# Patient Record
Sex: Male | Born: 1935 | Race: White | Hispanic: No | Smoking: Former smoker
Health system: Southern US, Community
[De-identification: ages and names within clinical notes are randomized; demographics above are authoritative.]

## PROBLEM LIST (undated history)

## (undated) DIAGNOSIS — E785 Hyperlipidemia, unspecified: Secondary | ICD-10-CM

## (undated) DIAGNOSIS — I509 Heart failure, unspecified: Secondary | ICD-10-CM

## (undated) DIAGNOSIS — I2510 Atherosclerotic heart disease of native coronary artery without angina pectoris: Secondary | ICD-10-CM

## (undated) HISTORY — DX: Atherosclerotic heart disease of native coronary artery without angina pectoris: I25.10

## (undated) HISTORY — DX: Hyperlipidemia, unspecified: E78.5

## (undated) HISTORY — PX: CORONARY ARTERY BYPASS GRAFT: SHX141

## (undated) HISTORY — DX: Heart failure, unspecified: I50.9

## (undated) HISTORY — PX: CARDIAC CATHETERIZATION: SHX172

---

## 1999-09-28 ENCOUNTER — Ambulatory Visit (HOSPITAL_COMMUNITY): Admission: RE | Admit: 1999-09-28 | Discharge: 1999-09-29 | Payer: Self-pay | Admitting: *Deleted

## 1999-12-01 ENCOUNTER — Encounter: Payer: Self-pay | Admitting: Cardiothoracic Surgery

## 1999-12-05 ENCOUNTER — Encounter: Payer: Self-pay | Admitting: Cardiothoracic Surgery

## 1999-12-05 ENCOUNTER — Inpatient Hospital Stay (HOSPITAL_COMMUNITY): Admission: RE | Admit: 1999-12-05 | Discharge: 1999-12-12 | Payer: Self-pay | Admitting: Cardiothoracic Surgery

## 1999-12-06 ENCOUNTER — Encounter: Payer: Self-pay | Admitting: Cardiothoracic Surgery

## 1999-12-07 ENCOUNTER — Encounter: Payer: Self-pay | Admitting: Cardiothoracic Surgery

## 2000-03-01 ENCOUNTER — Encounter: Payer: Self-pay | Admitting: Cardiothoracic Surgery

## 2000-03-01 ENCOUNTER — Encounter: Admission: RE | Admit: 2000-03-01 | Discharge: 2000-03-01 | Payer: Self-pay | Admitting: Cardiothoracic Surgery

## 2006-02-19 ENCOUNTER — Ambulatory Visit (HOSPITAL_COMMUNITY): Admission: RE | Admit: 2006-02-19 | Discharge: 2006-02-19 | Payer: Self-pay | Admitting: Family Medicine

## 2006-02-26 ENCOUNTER — Encounter (HOSPITAL_COMMUNITY): Admission: RE | Admit: 2006-02-26 | Discharge: 2006-03-28 | Payer: Self-pay | Admitting: Family Medicine

## 2006-03-06 ENCOUNTER — Ambulatory Visit: Payer: Self-pay | Admitting: Internal Medicine

## 2006-03-19 ENCOUNTER — Ambulatory Visit: Payer: Self-pay | Admitting: Cardiovascular Disease

## 2006-03-20 ENCOUNTER — Ambulatory Visit: Payer: Self-pay | Admitting: Cardiology

## 2006-03-20 ENCOUNTER — Ambulatory Visit (HOSPITAL_COMMUNITY): Admission: RE | Admit: 2006-03-20 | Discharge: 2006-03-20 | Payer: Self-pay | Admitting: Cardiovascular Disease

## 2006-06-27 ENCOUNTER — Ambulatory Visit: Payer: Self-pay | Admitting: Cardiovascular Disease

## 2006-08-01 ENCOUNTER — Ambulatory Visit: Payer: Self-pay | Admitting: Cardiovascular Disease

## 2006-10-24 ENCOUNTER — Ambulatory Visit: Payer: Self-pay | Admitting: Cardiovascular Disease

## 2007-01-24 ENCOUNTER — Ambulatory Visit: Payer: Self-pay | Admitting: Cardiovascular Disease

## 2007-04-16 ENCOUNTER — Ambulatory Visit (HOSPITAL_COMMUNITY): Admission: RE | Admit: 2007-04-16 | Discharge: 2007-04-16 | Payer: Self-pay | Admitting: Family Medicine

## 2007-04-19 ENCOUNTER — Ambulatory Visit: Payer: Self-pay | Admitting: Cardiovascular Disease

## 2008-05-27 ENCOUNTER — Encounter (INDEPENDENT_AMBULATORY_CARE_PROVIDER_SITE_OTHER): Payer: Self-pay | Admitting: *Deleted

## 2008-05-27 LAB — CONVERTED CEMR LAB
ALT: 16 units/L
AST: 16 units/L
Alkaline Phosphatase: 73 units/L
BUN: 20 mg/dL
Chloride: 99 meq/L
Cholesterol: 162 mg/dL
Creatinine, Ser: 1.29 mg/dL
HDL: 29 mg/dL
LDL Cholesterol: 110 mg/dL
TSH: 1.617 microintl units/mL
Total Protein: 7.7 g/dL
Triglycerides: 115 mg/dL

## 2008-06-24 DIAGNOSIS — E119 Type 2 diabetes mellitus without complications: Secondary | ICD-10-CM

## 2008-06-24 DIAGNOSIS — I2510 Atherosclerotic heart disease of native coronary artery without angina pectoris: Secondary | ICD-10-CM

## 2008-06-24 DIAGNOSIS — R0602 Shortness of breath: Secondary | ICD-10-CM

## 2008-06-24 DIAGNOSIS — R609 Edema, unspecified: Secondary | ICD-10-CM

## 2008-06-24 DIAGNOSIS — I10 Essential (primary) hypertension: Secondary | ICD-10-CM

## 2008-06-24 DIAGNOSIS — I255 Ischemic cardiomyopathy: Secondary | ICD-10-CM

## 2008-06-24 DIAGNOSIS — E7800 Pure hypercholesterolemia, unspecified: Secondary | ICD-10-CM | POA: Insufficient documentation

## 2008-06-25 ENCOUNTER — Ambulatory Visit: Payer: Self-pay | Admitting: Cardiovascular Disease

## 2009-01-11 ENCOUNTER — Encounter (INDEPENDENT_AMBULATORY_CARE_PROVIDER_SITE_OTHER): Payer: Self-pay | Admitting: *Deleted

## 2009-01-11 ENCOUNTER — Ambulatory Visit: Payer: Self-pay | Admitting: Cardiovascular Disease

## 2009-08-10 ENCOUNTER — Ambulatory Visit: Payer: Self-pay | Admitting: Cardiovascular Disease

## 2009-08-10 DIAGNOSIS — I509 Heart failure, unspecified: Secondary | ICD-10-CM | POA: Insufficient documentation

## 2009-08-16 ENCOUNTER — Encounter: Payer: Self-pay | Admitting: Cardiovascular Disease

## 2009-08-16 ENCOUNTER — Ambulatory Visit: Payer: Self-pay | Admitting: Cardiology

## 2009-08-16 ENCOUNTER — Ambulatory Visit (HOSPITAL_COMMUNITY): Admission: RE | Admit: 2009-08-16 | Discharge: 2009-08-16 | Payer: Self-pay | Admitting: Cardiovascular Disease

## 2010-03-08 NOTE — Assessment & Plan Note (Signed)
Summary: 6 mth f/u per checkout on 01/11/09/tg   Visit Type:  6 month follow up Primary Provider:  Dr. Regino Schultze   History of Present Illness: Phillip Henderson is seen today for F/U of ischemic DCM.  His EF has ranged from 20-35%.  He is funcitonal class one and has not wanted agressive w/u.  I tried to get him to think about a biV AICD since he has had a large anterior MI in 1979 and undoubtedly has a large scar substrate.  he has not wanted to have a cath or consideration for AICD or randomizaiton in the Determine Trial.  He is active with no perceived dyspnea, SSCP or edema.  He has not had palpitations or syncope.  he has been compliant with his meds.  I reviewed lab work form Dr. Lindalou Hose office 05/2008 and his lytes and lfts were fine.  Given his good functional status and lack of symptoms I told him we would continue medical Rx. but have a low thresshold for more agressive care should things change.  He needs a F/U echo for EF  Previous anterior MI in late 90's and CABG inj 2001 Coronary artery bypass grafting x 5 with the left internal mammary artery to the first diagonal coronary artery, sequential saphenous vein graft to the distal left anterior descending, reversed saphenous vein graft to the obtuse marginal, sequential saphenous vein graft to the proximal posterior descending and distal posterior descending.  Preventive Screening-Counseling & Management  Alcohol-Tobacco     Smoking Status: quit  Current Problems (verified): 1)  CHF  (ICD-428.0) 2)  Cad  (ICD-414.00) 3)  Cardiomyopathy  (ICD-425.4) 4)  Hypertension  (ICD-401.9) 5)  Hypercholesterolemia  (ICD-272.0) 6)  Dyspnea  (ICD-786.05) 7)  Diabetes Mellitus  (ICD-250.00) 8)  Edema  (ICD-782.3)  Current Medications (verified): 1)  Altace 10 Mg Caps (Ramipril) .... 2 Tabs By Mouth Once Daily 2)  Lopressor 100 Mg Tabs (Metoprolol Tartrate) .Marland Kitchen.. 1 Tab By Mouth Once Daily 3)  Pravastatin Sodium 20 Mg Tabs (Pravastatin Sodium) ....  Take One Tablet By Mouth Daily At Bedtime 4)  Klor-Con 10 10 Meq Cr-Tabs (Potassium Chloride) .Marland Kitchen.. 1 Tab By Mouth Once Daily 5)  Furosemide 40 Mg Tabs (Furosemide) .Marland Kitchen.. 1 Tab By Mouth Two Times A Day 6)  Aspirin 81 Mg Tbec (Aspirin) .... Take One Tablet By Mouth Daily 7)  Lantus .Marland Kitchen.. 60 Units A Once Daily  Allergies (verified): No Known Drug Allergies  Past History:  Past Medical History: Last updated: 06/25/2008 CAD: Ant MI 1979 CABG 2001 [place] D1 SVG LAD SVG om SVG PDA EF 30% CHF Diabetes:  lantus at night Hyperlipidemia  Past Surgical History: Last updated: 06/25/2008 CABG: 2001 [place]  Family History: Last updated: 06/25/2008 non-contributory  Social History: Last updated: 06/25/2008 Widowed Lives with daughter between [place] Non-smoker Non-drinker Drives and does all ADL's  Social History: Smoking Status:  quit  Review of Systems       Denies fever, malais, weight loss, blurry vision, decreased visual acuity, cough, sputum, SOB, hemoptysis, pleuritic pain, palpitaitons, heartburn, abdominal pain, melena, lower extremity edema, claudication, or rash.   Vital Signs:  Patient profile:   76 year old male Height:      72 inches Weight:      207 pounds BMI:     28.18 O2 Sat:      95 % on Room air Pulse rate:   54 / minute BP sitting:   122 / 73  (right arm)  Vitals Entered By:  Dreama Saa, CNA (August 10, 2009 10:36 AM)  O2 Flow:  Room air  Physical Exam  General:  Affect appropriate Healthy:  appears stated age HEENT: normal Neck supple with no adenopathy JVP normal no bruits no thyromegaly Lungs clear with no wheezing and good diaphragmatic motion Heart:  S1/S2 no murmur,rub, gallop or click PMI normal Abdomen: benighn, BS positve, no tenderness, no AAA no bruit.  No HSM or HJR Distal pulses intact with no bruits No edema Neuro non-focal Skin warm and dry    Impression & Recommendations:  Problem # 1:  CHF  (ICD-428.0) Stable continue current meds.  Echo His updated medication list for this problem includes:    Altace 10 Mg Caps (Ramipril) .Marland Kitchen... 2 tabs by mouth once daily    Lopressor 100 Mg Tabs (Metoprolol tartrate) .Marland Kitchen... 1 tab by mouth once daily    Furosemide 40 Mg Tabs (Furosemide) .Marland Kitchen... 1 tab by mouth two times a day    Aspirin 81 Mg Tbec (Aspirin) .Marland Kitchen... Take one tablet by mouth daily  Orders: 2-D Echocardiogram (2D Echo)  Problem # 2:  CAD (ICD-414.00) Distant CABG  No angina.  Does not want myovue. His updated medication list for this problem includes:    Altace 10 Mg Caps (Ramipril) .Marland Kitchen... 2 tabs by mouth once daily    Lopressor 100 Mg Tabs (Metoprolol tartrate) .Marland Kitchen... 1 tab by mouth once daily    Aspirin 81 Mg Tbec (Aspirin) .Marland Kitchen... Take one tablet by mouth daily  Problem # 3:  HYPERTENSION (ICD-401.9) Well controlled His updated medication list for this problem includes:    Altace 10 Mg Caps (Ramipril) .Marland Kitchen... 2 tabs by mouth once daily    Lopressor 100 Mg Tabs (Metoprolol tartrate) .Marland Kitchen... 1 tab by mouth once daily    Furosemide 40 Mg Tabs (Furosemide) .Marland Kitchen... 1 tab by mouth two times a day    Aspirin 81 Mg Tbec (Aspirin) .Marland Kitchen... Take one tablet by mouth daily  Problem # 4:  HYPERCHOLESTEROLEMIA (ICD-272.0) Labs per primary His updated medication list for this problem includes:    Pravastatin Sodium 20 Mg Tabs (Pravastatin sodium) .Marland Kitchen... Take one tablet by mouth daily at bedtime  Patient Instructions: 1)  Your physician recommends that you schedule a follow-up appointment in: 1 year 2)  Your physician has requested that you have an echocardiogram.  Echocardiography is a painless test that uses sound waves to create images of your heart. It provides your doctor with information about the size and shape of your heart and how well your heart's chambers and valves are working.  This procedure takes approximately one hour. There are no restrictions for this procedure.  Prevention & Chronic  Care Immunizations   Influenza vaccine: Not documented    Tetanus booster: Not documented    Pneumococcal vaccine: Not documented    H. zoster vaccine: Not documented  Colorectal Screening   Hemoccult: Not documented    Colonoscopy: Not documented  Other Screening   PSA: Not documented   Smoking status: quit  (08/10/2009)    Screening comments: quit in 1974  Diabetes Mellitus   HgbA1C: Not documented    Eye exam: Not documented    Foot exam: Not documented   High risk foot: Not documented   Foot care education: Not documented    Urine microalbumin/creatinine ratio: Not documented  Lipids   Total Cholesterol: 162  (05/27/2008)   LDL: 110  (05/27/2008)   LDL Direct: Not documented   HDL: 29  (05/27/2008)  Triglycerides: 115  (05/27/2008)    SGOT (AST): 16  (05/27/2008)   SGPT (ALT): 16  (05/27/2008)   Alkaline phosphatase: 73  (05/27/2008)   Total bilirubin: Not documented  Hypertension   Last Blood Pressure: 122 / 73  (08/10/2009)   Serum creatinine: 1.29  (05/27/2008)   Serum potassium 4.1  (05/27/2008)  Self-Management Support :    Diabetes self-management support: Not documented    Hypertension self-management support: Not documented    Lipid self-management support: Not documented

## 2010-06-21 NOTE — Assessment & Plan Note (Signed)
[facility]                       [place] CARDIOLOGY OFFICE NOTE   LOPEZ, DENTINGER                        MRN:          [id number]  DATE:10/24/2006                            DOB:          [birth year]    [name] returns today for followup.  He has had a previous anterior  wall MI back in 1979.  His EF has dropped a bit recently to 15% to 20%.  He has had previous bypass surgery.  The patient, unfortunately, does  not want really much in terms of invasive workup.  He has refused heart  catheterization.  He has refused referral for AICD.   We have been adjusting his medications.  His blood pressure had been  running high.   I talked to him at length today regarding stopping his amlodipine and  increasing his ramipril to 20 mg a day.  He is willing to do this.  He  is currently functional class I to II.  He walks on a daily basis.  He  is not having PND or orthopnea.  His weight is stable.  There has been  no lower extremity edema.  Fortunately, he has not been having any  significant chest pain.   [facility] understands the fact that he has an ischemic cardiomyopathy, and  that he is at risk for sudden death congestive heart failure.   REVIEW OF SYSTEMS:  Remarkable for continued problems with his sugar.  When we last saw him, we had talked to Dr. Nobie Putnam about stopping his  Actos.  Unfortunately, the Januvia is not quite doing the trick.  He  will follow up with him, as his blood sugars have been running high when  he checks them at home on a b.i.d. basis.   CURRENT MEDICATIONS:  Include:  1. Long-acting Lopressor 100 a day.  2. Lasix 20 a day.  3. Ramipril to be increased to 20 a day.  4. Amlodipine to be discontinued.  5. Pravastatin 20 a day.  6. Aspirin a day.  7. Janumet, current dose unknown.   EXAMINATION:  Remarkable for a healthy-appearing elderly white male in  no distress.  Affect is appropriate.  Weight is 204.  Blood  pressure is 138/70.  Pulse is 60 and regular.  HEENT:  Normal.  Carotids normal without bruit.  There is no lymphadenopathy.  No  thyromegaly.  No JVP elevation.  PMI is increased and laterally displaced.  LUNGS:  Clear with good diaphragmatic motion.  No wheezing.  ABDOMEN:  Benign.  Bowel sounds positive.  No AAA.  No bruit.  No  hepatosplenomegaly.  No hepatojugular reflux.  No tenderness.  Femorals were +3 bilaterally without bruit.  PTs are +3.  There is no  lower extremity edema.  NEUROLOGIC:  Nonfocal.  There is no muscular weakness.  SKIN:  Warm and dry.  His baseline EKG shows mildly poor R wave progression with lateral T  wave changes in V5 and V6.   IMPRESSION:  1. Coronary disease.  Previous coronary artery bypass graft.  Patient      refusing followup Myoview and/or heart  catheterization.  Currently,      not having chest pain.  Continue aspirin and beta blocker therapy.  2. Decreased ejection fraction with coronary disease.  Risk for      ventricular tachycardia.  Patient refuses risk stratification, and      EP followup.  Still does not want AICD.  3. Hypertension.  Currently stable.  Given his decreased ejection      fraction and ischemic cardiomyopathy, we have stopped his      amlodipine and increased his Ramipril.  He will follow up with me      in 3 months for this.  4. Diabetes.  Currently less optimally controlled.  Follow up with Dr.      Nobie Putnam in the next week or 2.  Continue to abstain from using      Actos, possibly add basal Lantus insulin to his Janumet.  5. Hypercholesterolemia.  Continue pravastatin, particularly in light      of his old bypass graft.  Followup lipid and liver profile in 6      months.   Overall, [name] is stable, and right now he only wants stent retained  medical therapy.  I will see him back in 3 months.     Noralyn Pick. Eden Emms, MD, [facility]  Electronically Signed    PCN/MedQ  DD: 10/24/2006  DT: 10/24/2006  Job #: [id number]

## 2010-06-21 NOTE — Assessment & Plan Note (Signed)
[facility]                       [place] CARDIOLOGY OFFICE NOTE   JERMICHAEL, BELMARES                        MRN:          [id number]  DATE:08/01/2006                            DOB:          20-Jul-1934    Merlene Pulling returns today for followup.  He has a history of anterior  wall MI in 1979.  His EF had been in the 30% range. It recently dropped  to 15% to 20%.  He is status post coronary bypass surgery.  The last  time I saw Inigo I had a long discussion with him regarding his need for  a heart cath given his marked change in the ejection fraction.  He is a  diabetic and may not have a warning system in regards to angina.   Culver did not want to have a heart cath.  When we stopped his Actos his  congestion and shortness of breath markedly improved.  He since has seen  Dr. Nobie Putnam and been started on Januvia.   Again, I spoke to [facility] regarding the need for heart cath.  I also spoke  to him about his candidacy for an AICD.  Since he is feeling better he  does not want to entertain the idea of either one.   I explained to him the risks of sudden death and recurrent MI.  He is  comfortable with this.   His review of systems is remarkable for some chronic diaphoresis and  sweating.  He says he does this because it is hot.  He is not having any  hypoglycemic reactions on the Januvia.  There has been no chest pain,  PND, orthopnea.  No lower extremity edema. Review of systems is  otherwise negative.   His medications include:  1. Metoprolol ER 100 daily.  2. Lasix 20 daily.  3. Metoclopramide 5 t.i.d.  4. Ramipril 10 daily.  5. Amlodipine 2.5.  6. Pravastatin 20 daily.  7. Aspirin daily.  8,  Januvia unknown dose.   The patient's exam is remarkable for a healthy appearing elderly white  male in no distress.  He is a bit sweaty with moist skin.  His weight is 211 which is down 7 pounds since stopping the Actos.  Blood pressure is 100/76,  pulse is 64 and regular.  Respiratory rate is  14.  He is afebrile.  HEENT:  Normal.  NECK:  Supple. There is mild JVP elevation.  There is no carotid bruit.  LUNGS:  Clear with normal diaphragmatic motion.  No wheezing.  There is an S1, S2 with a soft MR murmur at the apex.  PMI is increased  and laterally displaced.  He is status post sternotomy.  Bowel sounds are positive. There is mild hepatojugular reflux.  There is  no hepatosplenomegaly, no AAA, no tenderness, no bruits.  Femorals are +3 bilaterally without bruit.  PTs are +2.  There is no  lower extremity edema.  NEURO:  Nonfocal.  There is no muscular weakness.   IMPRESSION:  1. Ischemic cardiomyopathy, possible silent anterior myocardial      infarction.  The patient  refusing heart cath.  Continue current      dose of diuretics.  He appears euvolemic now that his Actos has      been stopped.  He has currently improved dysfunctional status from      II to I.  2. Blood pressure currently improved.  I suspect that he may be able      to stop his amlodipine in the future, particularly if there is any      lightheadedness, it is the least important drug for his ischemic      cardiomyopathy.  We will follow this in the future if his systolics      were to run much lower than the 100 to 110.  3. Diabetes, currently improved off Actos,  to avoid this and Avandia      at all costs.  Continue Januvia, followup hemoglobin A1C in 3      months.  4. Risk for sudden death discussed at length with patient and need for      an automatic implantable cardiodefibrillator. He refuses at this      time. He will call us if he changes his mind and we will arrange      for him to see electrophysiologist in [place].  5. I will see him back in 3 months.     Noralyn Pick. Eden Emms, MD, [facility]  Electronically Signed    PCN/MedQ  DD: 08/01/2006  DT: 08/01/2006  Job #: [id number]

## 2010-06-21 NOTE — Assessment & Plan Note (Signed)
[facility]                            CARDIOLOGY OFFICE NOTE   MYLIK, PRO                        MRN:          [id number]  DATE:04/19/2007                            DOB:          03-21-1934    Phillip Henderson returns today for followup.  He has longstanding ischemic  cardiomyopathy with previous CABG.  He has refused recent  catheterizations or EP evaluation.  He needs to have a BiV AICD but does  not want to have it.  I explained to him that it may help his heart  failure and prevent sudden death, but he said he would wait and see.  He  is having some dyspnea.  We adjusted his medicines with Dr. Nobie Putnam,  stopping his Actos.  His Lasix is now up to 40 a day.  He feels well.  Apparently he had a chest x-ray with Loraine Leriche recently, and there was a  question of some fluid.  Of course, he has cardiomegaly.  Overall, I  think he is doing better.  He is functional class II.  He does take his  diabetes seriously and is now on Lantus insulin.  His Actos was switched  to Prandin.  I reassured him that metformin was a good medicine and does  not cause fluid retention.   REVIEW OF SYSTEMS:  His review of systems is otherwise negative.  In  particular, he is not having chest pain, palpitations, or presyncope and  has chronic exertional dyspnea related to his heart failure.  His weight  has been stable, although he does not weigh himself every day.   MEDICATIONS:  1. Lopressor 100 mg long-acting a day.  2. Lasix 40 a day.  3. Ramipril 10 a day.  4. Pravastatin 20 a day.  5. An aspirin a day.  6. Metformin 500 a day.  7. Klor-Con 10 a day.  8. Prandin 2 mg b.i.d.  9. Insulin.   PHYSICAL EXAMINATION:  VITAL SIGNS:  Blood pressure is 130/80, pulse 75  and regular, weight 209, afebrile, respiratory rate 14.  HEENT:  Unremarkable.  NECK:  Carotids normal without bruit.  No lymphadenopathy, thyromegaly,  or JVP elevation.  LUNGS:  Clear with good  diaphragmatic motion.  No wheezing.  S1-S2 with  an S3 gallop.  PMI increased.  ABDOMEN:  Benign.  Bowel sounds positive.  No AAA.  No tenderness, no  hepatosplenomegaly, no hepatojugular reflexes.  EXTREMITIES:  Distal pulses are intact.  No edema.  NEUROLOGIC:  Nonfocal.  SKIN:  Warm and dry.  No muscular weakness.   His EKG shows sinus rhythm with left axis deviation and probable old  anterior MI.   IMPRESSION:  1. Ischemic cardiomyopathy, ejection fraction 20-30%.  Continue      current dose of diuretic and angiotensin-converting enzyme      inhibitor.  The patient refusing further workup in regards to      biventricular five the automatic implantable cardioverter      defibrillator.  2. Coronary artery disease, no angina.  Continue aspirin therapy.  The  patient on beta blocker with good heart rate.  3. Diabetes.  Hemoglobin A1c.  Follow up with Dr. Nobie Putnam.  Actos      stopped.  Prandin resumed.  4. Hypercholesteremia in the setting of coronary disease and old      bypass grafts.  Continue pravastatin 20 a day.  Lipid and liver      profile in 6 months.   Phillip Henderson knows to see me or Loraine Leriche is his weight were to go up or he were to  have increasing shortness of breath.  He also knows to take an extra  Lasix.  Depending on what the patient's baseline BMET is, it would be  worthwhile to add spironolactone 50 a day if he has recurrent heart  failure.   I will see him back in 6 months up in [place].     Noralyn Pick. Eden Emms, MD, [facility]  Electronically Signed    PCN/MedQ  DD: 04/19/2007  DT: 04/20/2007  Job #: [id number]

## 2010-06-21 NOTE — Assessment & Plan Note (Signed)
[facility]                       [place] CARDIOLOGY OFFICE NOTE   Phillip Henderson, Phillip Henderson                        MRN:          [id number]  DATE:06/27/2006                            DOB:          1935/01/14    Phillip Henderson returns today for followup. He is status post coronary artery  bypass surgery. He has had a previous anterior wall myocardial  infarction. His EF had been in the 45% range.   He had been having increasing congestive symptoms when I last saw him.  He had a 2D echocardiogram on 03/20/06.   The patient's EF has dropped to 15-20%.   We had stopped his Actos when I last saw him. He feels markedly  improved. He feels his shortness of breath has gotten significantly  better.   The patient does not want to have a heart cath. I talked to him at  length about the fact that his bypass grafts are old, being done in 2001  and that he is at significant decrease in his LV function.   He does not want to have a heart cath at this time. He feels that he has  improved. His Actos has been stopped and he has been switched to  metformin.   He understands the risk of recurrent congestive heart failure.   REVIEW OF SYSTEMS:  Is remarkable for a stable weight. He has had less  PND or orthopnea. He has not had any significant chest pain. His sugars  are still a little bit high.   MEDICATIONS:  1. Long-acting metoprolol 100 a day.  2. Lasix 20 a day.  3. Glip/met 5/500 b.i.d.  4. Metoclopramide 5 t.i.d.  5. Ramipril 10 a day.  6. Amlodipine 2.5 a day.  7. Pravastatin 20 a day.  8. Aspirin a day.   His weight is 218, which is stable. Blood pressure is 140/80, pulse 68  and regular.  HEENT: Is normal. JVP is mildly elevated. There are no carotid bruits.  No lymphadenopathy. No thyromegaly.  LUNGS:  Are clear with normal diaphragmatic motion and no wheezing.  There is an S1, S2 with a soft MR murmur. PMI is increased and laterally  displaced.  ABDOMEN: Is benign. There is no hepatosplenomegaly. There is mild  hepatojugular reflux. There is no AAA. No masses. No tenderness. Bowel  sounds are positive.  Femorals are +2 bilaterally without bruits. PT's are +2 bilaterally. He  has +1 lower extremity edema bilaterally.  NEURO: Is nonfocal.  SKIN: Is warm and dry.  There is no muscular weakness.   IMPRESSION:  1. History of coronary artery bypass surgery without chest pain, but      decrease in left ventricular function. This certainly could be from      remodeling. However, I would still recommend a right and left heart      cath to the patient. He wants to wait four weeks. He is encouraged      that he felt better off of the Actos.  2. I suspect that in the future I will stop his amlodipine.  3. His current dose  of Lasix seems appropriate. We will check a BMET      and a BNP. I will re-discuss the issues of heart cath with him in      four weeks.  4. He will followup with Dr.  Nobie Putnam in regards to better sugar      control. He will continue to avoid Avandia and Actos.  5. His last LDL cholesterol was under 644. He will continue      pravastatin and given his 27-year-old bypass grafts.     Noralyn Pick. Eden Emms, MD, [facility]  Electronically Signed    PCN/MedQ  DD: 06/27/2006  DT: 06/27/2006  Job #: [id number]   cc:   Patrica Duel, M.D.

## 2010-06-21 NOTE — Assessment & Plan Note (Signed)
[facility] HEALTHCARE                       [place] CARDIOLOGY OFFICE NOTE   Phillip Henderson, Phillip Henderson                        MRN:          [id number]  DATE:01/24/2007                            DOB:          1935-01-09    [name] returns today for followup. He is status post CABG.   He has refused further workup in regards to a stress test, cath or EP  evaluation. His EF is 15-20%. He had had some lower extremity edema, but  no symptoms of congestive heart failure recently. I was concerned about  some of his medications. We stopped his calcium blocker and his primary  care doctor, Dr. Nobie Putnam, stop his Actos. He is improved with less  shortness of breath and less lower extremity edema.   He has been compliant with his other medications.  He is not having any  significant chest pain, PND or orthopnea.   REVIEW OF SYSTEMS:  Is remarkable for cut on his finger which continues  to bleed. I told him we would put a Band-Aid on it with some neosporin  today. He did this chopping wood. Otherwise, negative.   Weight 204, blood pressure 140/80, pulse 58 and regular. Afebrile.  Respiratory rate is 14.  HEENT: Is unremarkable. Carotids are normal without bruits. There is no  lymphadenopathy, thyromegaly or JVP elevation.  LUNGS:  Are clear with good diaphragmatic motion. No wheezing.  There is an S1, S2. PMI is increased. There is a slight MR murmur at the  apex.  ABDOMEN: Is benign. Bowel sounds positive. No hepatosplenomegaly. No  hepatojugular reflux.  Distal pulses are intact with no edema.   IMPRESSION:  1. Ischemic cardiomyopathy, currently functional Class 1, stable.      Medications adjusted. Continue low salt diet.  2. Hypercholesterolemia. Continue pravastatin 20 a day. Lipid and      liver profile in six months.  3. Hypertension. Currently well-controlled. Continue ramipril 10 mg a      day.  4. Diabetes, off Actos. Hemoglobin A1c in three months.  Lower      extremity edema improved.  5. Cut on finger, dressed today. No evidence of infection. Should stop      bleeding within the hour.   MEDICATIONS:  1. Metoprolol 100 a day.  2. Lasix 20 a day.  3. Ramipril 10 a day.  4. Pravastatin 20 a day.  5. Aspirin a day.  6. Janumet 50/500.     Noralyn Pick. Eden Emms, MD, [facility]  Electronically Signed    PCN/MedQ  DD: 01/24/2007  DT: 01/24/2007  Job #: [id number]

## 2010-06-24 NOTE — Discharge Summary (Signed)
[place]. [facility]  Patient:    Phillip Henderson, CICERO                        MRN: [id number] Adm. Date:  [id number] Disc. Date: 12/10/99 Attending:  Waldo Laine Dictator:   Lissa Hoard, P.A.-C. CC:         Jesse Sans. Wall, M.D. [facility]   Discharge Summary  DATE OF BIRTH:  1934/11/15  ADMISSION DIAGNOSES: 1. Coronary artery disease. 2. Non-insulin-dependent diabetes mellitus.  DISCHARGE DIAGNOSES: 1. Coronary artery disease. 2. Non-insulin-dependent diabetes mellitus. 3. Status post coronary artery bypass grafting x 5.  HISTORY OF PRESENT ILLNESS:  This is a 76 year old male who has a history of a myocardial infarction in 1979.  He currently has stable angina, typical chest tightness, nonradiating, without nausea or vomiting, which occurs with activity.  Because of these symptoms, the patient had a Cardiolyte perfusion study which showed an area of reversible ischemia involving apical, septal, anterior, and inferior walls of his heart.  His ejection fraction was found to be 35%.  Because of his symptoms, he underwent a cardiac catheterization which revealed diffuse coronary artery disease and an ejection fraction of 40%.  PHYSICAL EXAMINATION:  VITAL SIGNS:  Blood pressure 140/60, pulse 72 and regular, respirations 18.  HEENT:  Within normal limits.  NECK:  No jugular venous distention, carotid bruits, adenopathy, or thyromegaly.  LUNGS:  Clear to auscultation bilaterally.  CARDIAC:  A regular rate and rhythm without murmurs, gallops, or rubs.  ABDOMEN:  Soft, nontender, nondistended, with positive bowel sounds in all four quadrants.  EXTREMITIES:  No cyanosis, clubbing, or edema.  NEUROLOGIC:  Grossly intact.  Because of the findings on his cardiac catheterization, the patient was referred to Dr. Ramon Dredge B. Gerhardt for possible surgical intervention.  HOSPITAL COURSE:  On December 05, 1999, the patient underwent a coronary  artery bypass grafting x 5 with the left internal mammary artery anastomosed to the first diagonal, sequential saphenous vein graft to the proximal posterior descending, and the distal posterior descending, saphenous vein graft to the circumflex, and saphenous vein graft to the left anterior descending coronary artery.  The procedure was performed by Dr. Tyrone Sage under general endotracheal anesthesia without any known complications.  The patient was transferred to the SICU in stable condition, in normal sinus rhythm.  He was requiring an insulin drip for the control of blood sugar postoperatively.  On postoperative day number one the patient was found to be in normal sinus rhythm, with a cardiac index of 2.88.  His mediastinal tubes were discontinued.  His postoperative hemoglobin and hematocrit were 12.3, and 33.6.  His insulin drip was discontinued and he was covered with subcutaneous sliding scale insulin, with good results.  On postoperative day number two his hemoglobin and hematocrit dropped slightly to 10.8, and 28.1.  His BUN and creatinine also rose slightly to 25, and 1.5.  His blood sugars were well-controlled.  His remaining chest tube was discontinued.  He was not requiring any oxygen therapy.  He was mobilizing well.  On postoperative day number three the patient was complaining of some nausea and vomiting, which resolved with the movement of the bowels.  His ambulation had increased and his hemoglobin and hematocrit had stabilized at 10.1 and 28.9.  His BUN and creatinine also started to come down, at 10.0 and 1.0 respectively.  On postoperative day number four the patient was doing well, with no complaints of  nausea or vomiting.  He was in normal sinus rhythm, afebrile, with stable vital signs, doing well.  He was mobilizing well, and he was tolerating p.o. intake.  DISPOSITION:  The discharge plan was made for postoperative number five, on December 10, 1999.  DISCHARGE  MEDICATIONS: 1. Darvocet-N 100 one or two tablets q.4-6h. p.r.n. pain. 2. Aspirin 325 mg one tablet q.d. 3. Altace 5 mg one tablet q.d. 4. Lipitor 20 mg one tablet q.d. 5. Prandin 1 mg t.i.d. 6. Pepcid 20 mg one tablet b.i.d.  DISCHARGE ACTIVITIES:  The patient was told to avoid driving or strenuous activities, or lifting objects over 10 pounds.  DISCHARGED DIET:  An 1800 calorie ADA diet.  WOUND CARE:  To clean the wounds with mild soap and water.  DISPOSITION:  Home.  FOLLOWUP:  The patient is instructed to follow up with Dr. Tyrone Sage on Thursday, January 05, 2000, at 11:10 a.m.  He was instructed to bring a chest x-ray taken by his cardiologist, Dr. Jesse Sans. Wall. The patient was also instructed to come to the CVTS office on Monday, December 19, 1999, for staple removal.  His was also instructed to follow up with his cardiologist, Dr. Maisie Fus C. Wall in two weeks.  The appointment with his cardiologist will be verified by the cardiologists office. DD:  12/09/99 TD:  12/11/99 Job: [id number]

## 2010-06-24 NOTE — Discharge Summary (Signed)
[place]. [facility]  Patient:    Phillip Henderson, Phillip Henderson                        MRN: [id number] Adm. Date:  [id number] Disc. Date: [id number] Attending:  Waldo Laine Dictator:   Loura Pardon, P.A. CC:         Jesse Sans. Daleen Squibb, M.D. [facility]  Gwenith Daily. Tyrone Sage, M.D.   Discharge Summary  ADDENDUM  DATE OF BIRTH:  1934/06/04  [name] was set for discharge on November 3; however, in the early morning of November 3 at 0015 hours to be specific, he had a paroxysmal bout of atrial fibrillation with heart rate in the 120s.  His blood pressure was 140/70, and he was asymptomatic.  He was started on IV digoxin.  Later in the morning of the same day, November 3, he was started on IV Cardizem drip and then changed to an oral Cardizem 30 mg every 6 hours.  On November 4, he had a paroxysm of atrial fibrillation when up and ambulating.  Heart rate was in the 140s.  He was continued on Cardizem, Lopressor, and digoxin through the day on November 4 and overnight into November 5.  He had no further dysrhythmias except occasional PVCs.  He was set to go home on December 12, 1999, with followup with Dr. Daleen Squibb and Dr. Tyrone Sage.  DISCHARGE MEDICATIONS:  His home medications will now include besides those already dictated: 1. Lopressor 50 mg 1/2 tablet in the morning, 1/2 tablet in the evening. 2. Cardizem CD 120 mg daily. 3. Digoxin 0.125 mg daily. DD:  12/12/99 TD:  12/13/99 Job: [id number]
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
## 2010-06-24 NOTE — Cardiovascular Report (Signed)
[place]. [facility]  Patient:    Phillip Henderson, Phillip Henderson                        MRN: [id number] Adm. Date:  [id number] Attending:  Daisey Must                        Cardiac Catheterization  INDICATION:  Mr. [name] is referred by Dr. Jesse Sans. Wall for recurrent angina.  He had a distant anterior wall MI in 1979.  Recent Cardiolite study showed an anteroapical wall infarction with an EF in the high 30s.  RESULTS:  Left main coronary artery had a 30% discrete lesion.  The ostial LAD had a 99% lesion.  The mid LAD was 100% occluded with trickle flow.  There was a large first diagonal branch that 40% multiple discrete lesions.  The left circumflex coronary artery had a long tubular area of 70% stenosis, which was somewhat complicated and aneurysmal.  Right coronary artery was very large in caliber.  There were 30% multiple discrete lesions in the mid and proximal portions.  There was a 70-80% discrete lesion before the takeoff of the PDA.  PDA had 30-40% multiple discrete lesions.  The posterolateral branch had 30% multiple discrete lesions.  RAO ventriculography:  RAO ventricular revealed anteroapical wall hypokinesis with an EF in the 45% range.  There was no MR and no gradiant across the aortic valve.  Left ventricular pressure was 107/31; aortic pressure was 109/69.  IMPRESSION:  Mr. [name] has three-vessel disease with decreased left ventricular function.  He is having exertional and postprandial angina.  We will admit him today for further evaluation.  The patient is very set against having surgery; however, given his clinical scenario and decreased left ventricular function, I think that coronary artery bypass grafting to the diagonal circumflex and posterior descending and posterolateral artery would be his best option.  Since his Cardiolite study was nonischemic, I am not sure we would be able to do culprit vessel angioplasty and certainly the  ostial left anterior descending lesion is not amenable to balloon procedure.  Further recommendations will be discussed with Dr. Daleen Squibb and the interventional doctors if the patient refuses to have coronary artery bypass graft. DD:  09/28/99 TD:  09/29/99 Job: [id number]

## 2010-06-24 NOTE — Consult Note (Signed)
NAME:  Phillip, Henderson                 ACCOUNT NO.:  [id number]   MEDICAL RECORD NO.:  [id number]          PATIENT TYPE:  AMB   LOCATION:                                FACILITY:  [facility]   PHYSICIAN:  R. Roetta Sessions, M.D. DATE OF BIRTH:  [birth year]   DATE OF CONSULTATION:  03/06/2006  DATE OF DISCHARGE:                                 CONSULTATION   CHIEF COMPLAINT:  Abdominal bloating.   HISTORY OF PRESENT ILLNESS:  [name] is a 76 year old gentleman who  presents for further evaluation of abdominal bloating.  He states off  and on for the last three to four months he has had periods where he  feels like he is bloated.  He describes having early satiety.  Denies  any nausea or vomiting.  He has had some belching but does not think it  is excessive.  When he eats, he feels like he can only eat a small  amount at a time.  He has had no weight loss.  Sometimes when he wakes  up in the middle of the night, he feels like his abdomen is bloated and  this causes pressure in his chest and he cannot breathe.  He denies any  cough.  No chest pain.  His bowel movements have remained regular.  No  melena or rectal bleeding.  He states with Welchol he had a lot of  bloating, but discontinued the medication.  He recently started on  Pravastatin for his cholesterol.  He was started on Reglan 5 mg t.i.d.  as needed.  He states he has taken it five or six times and this helped.  He has never had an EGD or a colonoscopy.   A chest x-ray revealed cardiomegaly, status post coronary artery bypass  graft surgery, with questionable mild congestive heart failure.  No  acute abdominal findings.   Labs revealed a normal CBC and liver function tests.  His hemoglobin A1c  was 6.6.   A gastric emptying study revealed accelerated gastric emptying at one  hour.  Essentially all ingested tracer was emptied from the stomach.  The patient took Reglan one dose on the day prior to the study, but none  on the day  of the study.  An abdominal ultrasound was normal.  The  pancreas was not visualized due to bowel gas.   The patient states he has taken five or six Reglan and seems to have  some improvement in his symptoms.  He is not interested in pursuing any  further testing at this time.   CURRENT MEDICATIONS:  1. Altace 10 mg daily.  2. Metoprolol 100 mg daily.  3. Glyburide/Metformin b.i.d.  4. Bayer aspirin 325 mg daily.  5. Pravastatin 10 mg daily.  6. Lasix 20 mg daily.  7. Reglan 5 mg t.i.d. p.r.n.  8. Actos 30 mg daily.   ALLERGIES:  No known drug allergies.   PAST MEDICAL/SURGICAL HISTORY:  1. History of coronary artery disease, status post coronary artery      bypass graft surgery of five vessels in 2002.  2.  Hypercholesterolemia.  3. Cardiomegaly.  4. History of mild congestive heart failure.  5. Diabetes mellitus of 8 to 10 years' duration.  6. He has had back surgery.  No prior EGD or colonoscopy.   FAMILY HISTORY:  Negative for colorectal cancer.   SOCIAL HISTORY:  He is widowed.  He is retired.  He quit smoking 35  years ago.  No alcohol use.   REVIEW OF SYSTEMS:  See the HPI for GI.  CONSTITUTIONAL:  See the HPI.  CARDIOPULMONARY:  He denies any chest pain.  He states when his abdomen  is swollen, he feels some difficulty with breathing.  EXTREMITIES:  No  edema in the lower extremities.   PHYSICAL EXAMINATION:  VITAL SIGNS:  Weight 221 pounds, height 6 feet,  temperature 97.8 degrees, blood pressure 130/78, pulse 60.  GENERAL:  A pleasant elderly Caucasian male, in no acute distress.  SKIN:  Warm and dry.  No jaundice.  HEENT:  Conjunctivae pink.  Sclerae anicteric.  Oropharyngeal mucosa  moist and pink.  No lesions, erythema or exudates.  NECK:  No lymphadenopathy or thyromegaly.  CHEST:  Lungs are clear to auscultation.  HEART:  A regular rate and rhythm.  No murmurs, rubs or gallops.  ABDOMEN:  Positive bowel sounds.  The abdomen is very soft, nontender.  No  organomegaly or masses.  No rebound tenderness or guarding.  No  abdominal bruits or hernia.  EXTREMITIES:  No edema.   IMPRESSION:  1. [name] is a 76 year old gentleman with complaints of      intermittent abdominal bloating and early satiety.  No evidence of      gastroparesis on recent gastric emptying study.  An abdominal      ultrasound unremarkable.  2. Shortness of breath most likely unrelated to his gastrointestinal      symptoms.  A recent chest x-ray  suggested mild congestive heart failure.  He has an appointment to see  his cardiologist.   RECOMMENDATIONS:  I discussed options of further workup of his GI  symptoms, including an EGD or upper GI series initially.  He has never  had a colonoscopy and he declines on that.  He states that he does not  want any further workup of his GI symptoms for now, as he feels like he  is doing better with p.r.n. Reglan.  I encouraged a trial of PPI therapy  in case we might be dealing with peptic ulcer disease; however, he  declined as well.   PLAN:  1. The patient refused any further workup at this time.  Alarm      symptoms were noted to the patient in the way of      increased abdominal pain, nausea, vomiting, melena or rectal      bleeding, fever or chills.  If he has any symptoms, he is to call      [place] as soon as possible or go to the emergency room.  2. Otherwise he states that he will call us in a couple of weeks if      his symptoms are ongoing.      Tana Coast, P.AJonathon Bellows, M.D.  Electronically Signed    LL/MEDQ  D:  03/06/2006  T:  03/06/2006  Job:  [id number]   cc:   Patrica Duel, M.D.  Fax: [phone]

## 2010-06-24 NOTE — Procedures (Signed)
NAME:  Phillip Henderson, Phillip Henderson NO.:  [id number]   MEDICAL RECORD NO.:  [id number]          PATIENT TYPE:  OUT   LOCATION:  RAD                           FACILITY:  [facility]   PHYSICIAN:  Gerrit Friends. Dietrich Pates, MD, FACCDATE OF BIRTH:  03-Mar-1934   DATE OF PROCEDURE:  DATE OF DISCHARGE:                                ECHOCARDIOGRAM   No dictation for this job number.      Gerrit Friends. Dietrich Pates, MD, [facility]  Electronically Signed     RMR/MEDQ  D:  03/20/2006  T:  03/20/2006  Job:  [id number]

## 2010-06-24 NOTE — Procedures (Signed)
NAME:  SAI, ZINN NO.:  [id number]   MEDICAL RECORD NO.:  [id number]          PATIENT TYPE:  OUT   LOCATION:  RAD                           FACILITY:  [facility]   PHYSICIAN:  Gerrit Friends. Dietrich Pates, MD, FACCDATE OF BIRTH:  Oct 15, 1934   DATE OF PROCEDURE:  03/20/2006  DATE OF DISCHARGE:  03/20/2006                                ECHOCARDIOGRAM   REFERRING PHYSICIANS:  Patrica Duel, M.D.  Noralyn Pick. Eden Emms, MD, [facility]   CLINICAL DATA:  A 76 year old gentleman with prior CABG surgery; now  with congestive heart failure.  Aorta 3.4, left atrium 5.3, septum 1.2,  posterior wall 1.3, LV diastole 6.3, LV systole 5.8.    1. Technically adequate echocardiographic study.  2. Mild to moderate right atrial enlargement; moderate left atrial      enlargement.  3. Mild right ventricular dilatation; mild RVH; RV systolic function      is low normal.  4. Mild to moderate sclerosis of the aortic leaflets; moderate      calcification of the wall of the proximal ascending aorta and      aortic annulus.  5. Normal tricuspid valve; trivial regurgitation; moderate elevation      in estimated RV systolic pressure.  6. Normal mitral valve; mild annular calcification; very mild      regurgitation.  7. Normal pulmonic valve and proximal pulmonary artery.  8. Moderate left ventricular dilatation; mild to moderate hypokinesis      of the inferior and posterior segments; virtual akinesis of the      septum, anterior wall and apex.  Overall LV systolic function is      severely impaired with an estimated ejection fraction of 0.15 -      0.20.  9. Normal IVC.      Gerrit Friends. Dietrich Pates, MD, [facility]  Electronically Signed     RMR/MEDQ  D:  03/22/2006  T:  03/22/2006  Job:  [id number]

## 2010-06-24 NOTE — Op Note (Signed)
[place]  Patient:    Phillip Henderson, Phillip Henderson                        MRN: [id number] Proc. Date: 12/05/99 Adm. Date:  [id number] Attending:  Waldo Laine CC:         Gwenith Daily. Tyrone Sage, M.D.  Noralyn Pick. Eden Emms, M.D. [facility]   Operative Report  PREOPERATIVE DIAGNOSIS:  Coronary occlusive disease.  POSTOPERATIVE DIAGNOSIS:  Coronary occlusive disease.  OPERATION PERFORMED:  Coronary artery bypass grafting x 5 with the left internal mammary artery to the first diagonal coronary artery, sequential saphenous vein graft to the distal left anterior descending, reversed saphenous vein graft to the obtuse marginal, sequential saphenous vein graft to the proximal posterior descending and distal posterior descending.  SURGEON:  Gwenith Daily. Tyrone Sage, M.D.  FIRST ASSISTANT:  Lissa Merlin, P.A.  ANESTHESIA:  General.  INDICATIONS FOR PROCEDURE:  The patient is a 76 year old male with known diabetes with a history of a remote myocardial infarction in 1979.  He had noted increasing chest tightness especially when ambulating up hill or ambulating after eating.  Cardiac perfusion studies showed reversible ischemia in the apical, septal, anterior and inferior walls.  Overall ejection fraction was 35%.  Because of the Cardiolite study a cardiac catheterization was done which showed a  30% left main, ostial LAD of 99% giving off a large first diagonal branch with 40%, circumflex had a tubal stenosis of 70.  The LAD in the midportion was totally occluded and had collateral filling that was a very small vessel.  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 There was a 78% lesion just prior to the take off of the posterior descending coronary artery and a second lesion in the midportion of the posterior descending coronary artery.  Because of the patients positive cardiolite, ____________  coronary artery bypass grafting was  recommended.  The patient agreed and signed informed consent. The patient agreed and signed informed consent.  DESCRIPTION OF PROCEDURE:  With Swan-Ganz and arterial line monitors in place, the patient underwent general endotracheal anesthesia without incident.  The skin of the chest and legs was prepped with Betadine and draped in the usual sterile manner.  Vein was harvested first from the left thigh.  The patient had severe varicosities in the right leg and lesser varicosities in the left lower leg.  A median sternotomy was performed and the left internal mammary artery was dissected down as a pedicle graft.  The distal artery was divided and had good free flow.  The pericardium was opened.  Overall ventricular function appeared slightly depressed but without any obvious wall motion abnormalities.  The patient was systemically heparinized.  The ascending aorta and the right atrium were cannulated in the aortic root.  A bent cardioplegia needle was introduced into the ascending aorta.  The patient was placed on cardiopulmonary bypass at 2.4 L per minute per meter squared.  The proximal and distalmost posterior descending coronary artery were dissected out.  The left anterior descending coronary artery was a very small vessel that was totally occluded.  The first diagonal was a larger vessel.  It for this reason, was selected to place the mammary to it rather than the LAD.  The patients body temperature was then cooled to 30 degrees.  The aortic crossclamp was applied.  500 cc of cold blood potassium cardioplegia was administered with rapid diastolic arrest  of the heart.  Myocardial and septal temperature was monitored throughout the crossclamp period.  Attention was turned first to the proximal posterior descending, which was opened and admitted a 1.5 mm probe distally but not proximally.  Using a longitudinal side-to-side anastomosis was carried out with a segment of reversed saphenous  vein.  The distal extent of the same vein was then carried to the midportion of the posterior descending which was opened.  This vessel passed a 1.5 mm probe distally but not proximally.  Using running 7-0 Prolene, a distal anastomosis was performed.  Attention was then turned to the circumflex coronary artery which was opened and admitted a 1.5 mm probe. Using a running 7-0 Prolene, distal anastomosis was performed.  The left anterior descending coronary artery was then then opened in the mid to distal portion of the vessel.  The LAD was a very small vessel and only admitted a 1 mm probe proximally and distally.  A 1.5 mm probe would not pass. Using a running 7-0 Prolene, segment of reversed saphenous vein was anastomosed to the left anterior descending coronary artery.  Additional cold blood cardioplegia was administered down the vein grafts.  The left internal mammary artery was then trimmed in such a way to reach to the first diagonal coronary artery. This vessel was opened and was partially intramyocardial.  Did admit a 1.5 mm probe distally and proximally.  Using a running 8-0 Prolene, the left internal mammary artery was anastomosed to the first diagonal coronary artery.  With release of the Edwards bulldog, the heart began fibrillating.  The aortic cross clamp was removed.  Total cross clamp time was 66 minutes.  The partial occlusion clamp was placed.  The patient spontaneously converted to a sinus rhythm.  Partial occlusion clamp was placed on the  ascending aorta.  Three punch aortotomies were performed. Each of the three vein grafts was anastomosed to the ascending aorta.  Air was evacuated from the grafts.  The partial occlusion clamp was removed.  Sites of anastomosis were inspected and free of bleeding with the exception of the circumflex graft which required a single tacking stitch.  The patient was then ventilated and weaned from cardiopulmonary bypass without difficulty.   He remained hemodynamically  stable and was decannulated in the usual fashion.  Protamine sulfate was administered.  With the operative field hemostatic, two atrial and two ventricular pacing wires were applied.  Graft markers were applied.  A left pleural tube and two mediastinal tubes were left in place.  The sternum was closed with #6 stainless steel wire, the fascia was closed with interrupted 0 Vicryl and running 3-0 Vicryl in the subcutaneous tissue and 4-0 subcuticular stitch in the skin edges.  Dry dressings were applied.  The sponge and needle counts were reported as correct at completion of the procedure.  The patient tolerated the procedure without obvious complication and was transferred to the surgical intensive care unit for further postoperative care.  Total pump time was 156 minutes. DD:  12/05/99 TD:  12/05/99 Job: [id number]

## 2010-06-27 ENCOUNTER — Other Ambulatory Visit: Payer: Self-pay | Admitting: Cardiovascular Disease

## 2010-07-08 ENCOUNTER — Inpatient Hospital Stay (HOSPITAL_COMMUNITY)
Admission: EM | Admit: 2010-07-08 | Discharge: 2010-07-10 | DRG: 293 | Disposition: A | Discharge status: Home / Self Care | Payer: Medicare Other | Attending: Internal Medicine | Admitting: Internal Medicine

## 2010-07-08 ENCOUNTER — Emergency Department (HOSPITAL_COMMUNITY): Payer: Medicare Other

## 2010-07-08 DIAGNOSIS — Z794 Long term (current) use of insulin: Secondary | ICD-10-CM

## 2010-07-08 DIAGNOSIS — E785 Hyperlipidemia, unspecified: Secondary | ICD-10-CM | POA: Diagnosis present

## 2010-07-08 DIAGNOSIS — I10 Essential (primary) hypertension: Secondary | ICD-10-CM | POA: Diagnosis present

## 2010-07-08 DIAGNOSIS — IMO0001 Reserved for inherently not codable concepts without codable children: Secondary | ICD-10-CM | POA: Diagnosis present

## 2010-07-08 DIAGNOSIS — I5023 Acute on chronic systolic (congestive) heart failure: Principal | ICD-10-CM | POA: Diagnosis present

## 2010-07-08 DIAGNOSIS — I4891 Unspecified atrial fibrillation: Secondary | ICD-10-CM | POA: Diagnosis present

## 2010-07-08 DIAGNOSIS — I509 Heart failure, unspecified: Secondary | ICD-10-CM | POA: Diagnosis present

## 2010-07-08 LAB — DIFFERENTIAL
Basophils Relative: 1 % (ref 0–1)
Eosinophils Absolute: 0.1 10*3/uL (ref 0.0–0.7)
Lymphs Abs: 1.5 10*3/uL (ref 0.7–4.0)
Monocytes Absolute: 0.8 10*3/uL (ref 0.1–1.0)
Monocytes Relative: 8 % (ref 3–12)
Neutrophils Relative %: 77 % (ref 43–77)

## 2010-07-08 LAB — BASIC METABOLIC PANEL
BUN: 20 mg/dL (ref 6–23)
Creatinine, Ser: 1.28 mg/dL (ref 0.4–1.5)
GFR calc Af Amer: >60 mL/min (ref >60)
GFR calc non Af Amer: 55 mL/min — ABNORMAL LOW (ref >60)
Potassium: 3.6 mEq/L (ref 3.5–5.1)

## 2010-07-08 LAB — CBC
MCH: 28.3 pg (ref 26.0–34.0)
MCHC: 32.3 g/dL (ref 30.0–36.0)
MCV: 87.7 fL (ref 78.0–100.0)
Platelets: 212 10*3/uL (ref 150–400)
RBC: 5.44 MIL/uL (ref 4.22–5.81)

## 2010-07-08 LAB — PRO B NATRIURETIC PEPTIDE: Pro B Natriuretic peptide (BNP): 4832 pg/mL — ABNORMAL HIGH (ref 0–125)

## 2010-07-08 LAB — CK TOTAL AND CKMB (NOT AT ARMC)
Relative Index: INVALID (ref 0.0–2.5)
Total CK: 63 U/L (ref 7–232)

## 2010-07-08 LAB — TROPONIN I: Troponin I: <0.3 ng/mL (ref <0.30)

## 2010-07-08 LAB — GLUCOSE, CAPILLARY
Glucose-Capillary: 224 mg/dL — ABNORMAL HIGH (ref 70–99)
Glucose-Capillary: 165 mg/dL — ABNORMAL HIGH (ref 70–99)

## 2010-07-08 LAB — TSH: TSH: 0.599 u[IU]/mL (ref 0.350–4.500)

## 2010-07-09 LAB — DIFFERENTIAL
Basophils Absolute: 0 10*3/uL (ref 0.0–0.1)
Basophils Relative: 0 % (ref 0–1)
Eosinophils Absolute: 0.1 10*3/uL (ref 0.0–0.7)
Eosinophils Relative: 1 % (ref 0–5)
Monocytes Absolute: 0.7 10*3/uL (ref 0.1–1.0)

## 2010-07-09 LAB — CBC
MCHC: 31.6 g/dL (ref 30.0–36.0)
Platelets: 176 10*3/uL (ref 150–400)
RDW: 15.9 % — ABNORMAL HIGH (ref 11.5–15.5)

## 2010-07-09 LAB — LIPID PANEL
HDL: 22 mg/dL — ABNORMAL LOW (ref >39)
LDL Cholesterol: 104 mg/dL — ABNORMAL HIGH (ref 0–99)

## 2010-07-09 LAB — BASIC METABOLIC PANEL
CO2: 25 mEq/L (ref 19–32)
Calcium: 10.1 mg/dL (ref 8.4–10.5)
GFR calc Af Amer: >60 mL/min (ref >60)
Potassium: 3.8 mEq/L (ref 3.5–5.1)
Sodium: 138 mEq/L (ref 135–145)

## 2010-07-09 LAB — HEMOGLOBIN A1C: Hgb A1c MFr Bld: 9.5 % — ABNORMAL HIGH (ref <5.7)

## 2010-07-09 LAB — HEPATIC FUNCTION PANEL
Albumin: 3.5 g/dL (ref 3.5–5.2)
Total Bilirubin: 1.1 mg/dL (ref 0.3–1.2)
Total Protein: 6.6 g/dL (ref 6.0–8.3)

## 2010-07-09 LAB — GLUCOSE, CAPILLARY: Glucose-Capillary: 316 mg/dL — ABNORMAL HIGH (ref 70–99)

## 2010-07-09 LAB — MRSA PCR SCREENING: MRSA by PCR: NEGATIVE

## 2010-07-10 LAB — BASIC METABOLIC PANEL
CO2: 32 mEq/L (ref 19–32)
Chloride: 98 mEq/L (ref 96–112)
Creatinine, Ser: 1.23 mg/dL (ref 0.4–1.5)
GFR calc Af Amer: >60 mL/min (ref >60)

## 2010-07-10 LAB — GLUCOSE, CAPILLARY: Glucose-Capillary: 199 mg/dL — ABNORMAL HIGH (ref 70–99)

## 2010-07-10 NOTE — H&P (Signed)
NAME:  Phillip Henderson, Phillip Henderson NO.:  [id number]  MEDICAL RECORD NO.:  [id number]           PATIENT TYPE:  I  LOCATION:  IC12                          FACILITY:  [facility]  PHYSICIAN:  Elliot Cousin, M.D.    DATE OF BIRTH:  [birth year]  DATE OF ADMISSION:  07/08/2010 DATE OF DISCHARGE:  LH                             HISTORY & PHYSICAL   PRIMARY CARE PHYSICIAN:  Kirk Ruths, MD  PRIMARY CARDIOLOGIST:  Noralyn Pick. Eden Emms, MD, [facility]  CHIEF COMPLAINT:  Shortness of breath and abdominal swelling.  HISTORY OF PRESENT ILLNESS:  The patient is a 76 year old man with a past medical history significant for ischemic cardiomyopathy, type 2 diabetes mellitus, and paroxysmal atrial fibrillation.  He presents to the emergency department today with a chief complaint of shortness of breath.  His shortness of breath started approximately 1 week ago.  He has had orthopnea for the past several days.  He has had abdominal swelling, but no outright pain.  He has had nausea, but no heartburn and no indigestion.  He has only had mild swelling of his ankles, but not out of the ordinary.  He has had a cough with white thin sputum.  He has had no chest pain.  He has had no subjective fever or chills.  He decided to decrease his fluid intake.  He purposely did not take Lasix yesterday because of his decrease in fluid intake.  His daughter states that he eats a sausage biscuit every morning.  He is also very fond [place] sausages.  The patient explains that he does not add salt to his food however.  In the emergency department, the patient is noted to be afebrile and tachycardic with a heart rate ranging from 100 to 125 beats per minute. His blood pressure currently is 127/90.  His BNP is elevated at 4832. His chest x-ray reveals mild central pulmonary vascular congestion and small left pleural effusion.  His venous glucose is 262.  His BUN is 20 and his creatinine is 1.28.  His EKG reveals atrial  fibrillation with a heart rate of 125 beats per minute, left axis deviation, and a nonspecific intraventricular block.  He is being admitted for further evaluation and management.  PAST MEDICAL HISTORY: 1. Ischemic cardiomyopathy.  The patient's EF was 10% to     15% per 2-D echocardiogram in July 2011.  AICD/defibrillator was     recommended by Dr. Eden Emms; however, the patient refused. 2. Coronary artery disease, status post CABG. 3. Hypertension. 4. Paroxysmal atrial fibrillation.  The patient has refused Coumadin     therapy in the past. 5. Type 2 diabetes mellitus. 6. Hyperlipidemia. 7. Status post lumbar back surgery.  MEDICATIONS: 1. Metoprolol 100 mg daily (it is unknown clear if it is tartrate or     succinate). 2. Pravastatin 20 mg at bedtime. 3. Klor-Con 10 mEq daily. 4. Lasix 40 mg b.i.d. 5. Ramipril 20 mg daily. 6. Aspirin 81 mg daily. 7. Lantus insulin 45 units which was recently decreased from 65 units     per the patient due to recent low blood  sugars.  ALLERGIES:  The patient has an allergy and/or intolerance to PHENERGAN.  FAMILY HISTORY:  His mother died of a stroke.  His father died of lung cancer.  SOCIAL HISTORY:  The patient lives in [place] alone.  He does have family members who visit daily.  He is widowed.  He has four children in all.  He stopped smoking in the 1970s.  He denies alcohol use.  He still drives.  REVIEW OF SYSTEMS:  The patient's review of systems is positive for occasional swelling in his ankles, low back pain, otherwise review of systems is negative.  PHYSICAL EXAMINATION:  VITAL SIGNS:  Temperature 97.6, blood pressure 127/90, pulse 94, respiratory rate 20, oxygen saturation 96% on 2 liters of oxygen. GENERAL:  The patient is a pleasant alert 76 year old Caucasian man who is currently sitting up in bed, in no acute distress. HEENT:  Head is normocephalic, nontraumatic.  Pupils equal, round, reactive to light.  Extraocular  muscles are intact.  Conjunctivae are clear.  Sclerae are white.  Tympanic membranes not examined.  Nasal mucosa is dry.  No sinus tenderness.  Oropharynx reveals moist mucous membranes.  No posterior exudates or erythema.  Neck:  Supple.  No adenopathy, no thyromegaly, no bruit, no JVD.  LUNGS:  Breathing is nonlabored.  Decreased breath sounds in the bases with only occasional crackle auscultated in the lower mid lungs. HEART:  Irregularly irregular with mild tachycardia.  Chest wall well- healed sternotomy scar. ABDOMEN:  Mildly obese positive bowel sounds, soft, nontender, nondistended.  No hepatosplenomegaly.  I cannot appreciate ascites. There is no fluid wave.  No obvious masses palpated.  No obvious hepatosplenomegaly.  Exam for these findings is difficult due to his obese abdomen. GU AND RECTAL:  Deferred. EXTREMITIES:  There are multiple varicosities on his right lower leg and only a few on his left lower leg.  Pedal pulses are barely palpable. Trace of pedal edema bilaterally. NEUROLOGIC:  The patient is alert and oriented x3.  Cranial nerves II through XII are intact.  Strength is 5/5 throughout.  Sensation is intact.  ADMISSION LABORATORIES:  Chest x-ray and EKG results were dictated above.  WBC 10.6, hemoglobin 15.4, platelet count 212.  PT 16.0, INR 1.26, PTT 29, troponin I less than 0.30, BNP 4832.  Sodium 138, potassium 3.6, chloride 99, CO2 24, glucose 262, BUN 20, creatinine 1.28, calcium 10.6, CK 63, CK-MB 3.4.  ASSESSMENT: 1. Shortness of breath secondary to decompensated systolic congestive     heart failure with exacerbation.  The patient has mild peripheral     edema, pulmonary vascular congestion, and left-sided pleural     effusion, all clinical findings suggestive of decompensated     congestive heart failure.  His BNP is approximately 5000. 2. Suspect an element of chronic noncompliance.  Certainly, the     patient is noncompliant with a heart healthy  diabetic diet. 3. Type 2 diabetes mellitus, relatively uncontrolled.  His venous     glucose is 262. 4. Paroxysmal atrial fibrillation with rapid ventricular response.     The patient's heart rate has been ranging from the 90s up to the     120s in the emergency department.  I wonder if he developed     a rapid ventricular rate at home which led to flash     pulmonary edema. 5. Coronary artery disease.  The patient has had no chest pain.  His     cardiac enzymes are negative in the emergency  department. 6. Hypertension.  He is treated chronically with metoprolol and     ramipril. 7. Hyperlipidemia.  He is treated chronically with pravastatin.  PLAN: 1. The patient was given 40 mg of Lasix intravenously in the emergency     department.  We will continue Lasix at 40 mg IV q.12 hours. 2. We will continue his chronic medications.  We will add small doses     of p.o. Cardizem at 30 mg q.6 hours for better rate control. 3. For further evaluation, we will check a blood magnesium level, TSH,     free T4, hemoglobin A1c, and lipase. 4. We will add MiraLax laxative for probable underlying constipation. 5. We will check his fasting lipid profile in the morning. 6. Education on a heart healthy medium carbohydrate modified diet     will be ordered.     Elliot Cousin, M.D.     DF/MEDQ  D:  07/08/2010  T:  07/08/2010  Job:  [id number]  cc:   Kirk Ruths, M.D. Fax: [phone]  Noralyn Pick. Eden Emms, MD, [facility] [address]  [address] [place] [address]  Electronically Signed by Elliot Cousin M.D. on 07/10/2010 06:25:08 PM

## 2010-07-13 NOTE — Discharge Summary (Signed)
NAME:  Phillip Henderson, Phillip Henderson NO.:  [id number]  MEDICAL RECORD NO.:  [id number]           PATIENT TYPE:  I  LOCATION:  A329                          FACILITY:  [facility]  PHYSICIAN:  Elliot Cousin, M.D.    DATE OF BIRTH:  November 27, 1934  DATE OF ADMISSION:  07/08/2010 DATE OF DISCHARGE:  LH                              DISCHARGE SUMMARY   DISCHARGE DIAGNOSES: 1. Acute on chronic decompensated systolic congestive heart failure. 2. Left-sided pleural effusion secondary to decompensated congestive     heart failure. 3. Paroxysmal atrial fibrillation with rapid ventricular response on     admission.  The patient refuses Coumadin therapy. 4. Type 2 diabetes mellitus, generally uncontrolled.  His hemoglobin     A1c was 9.5. 5. Dyslipidemia.  His total cholesterol was 144, triglycerides 90, HDL     cholesterol 22, and LDL cholesterol 104. 6. Hypertension, controlled. 7. Normal thyroid function with a free T4 of 1.3 and a TSH of 0.599. DISCHARGE MEDICATIONS.: 1. Diltiazem 30 mg b.i.d. (new medication). 2. NovoLog sliding scale b.i.d. as directed (new medication). 3. Senokot-S 1 tablet b.i.d. as needed for constipation. 4. Klor-Con.  The dose was increased from 10 mEq once daily to 1-1/2     tablets daily. 5. Lantus insulin.  The dose was increased from 50 units to 60 units     nightly. 6. Lasix 40 mg tablet.  The dose was increased to 1-1/2 tablets twice     a day rather than 1 tablet twice a day. 7. Aspirin 81 mg daily. 8. Metoprolol tartrate 100 mg half a tablet b.i.d. 9. Pravastatin 20 mg nightly. 10.Ramipril 10 mg 2 tablets daily.  DISCHARGE DISPOSITION:  The patient was discharged to home in improved and stable condition on July 10, 2010.  He was advised to follow up with his primary care physician, Dr. Regino Schultze in 3-5 days and with cardiologist, Dr. Eden Emms or colleague in 1-2 weeks.  PROCEDURE PERFORMED:  Chest x-ray on July 08, 2010.  The results revealed mild central  pulmonary vascular congestion and small left pleural effusion.  CONSULTATIONS:  None.  HISTORY OF PRESENTING ILLNESS:  The patient is a 76 year old man with a past medical history significant for ischemic cardiomyopathy, type 2 diabetes mellitus, and paroxysmal atrial fibrillation.  He presented to the emergency department on July 08, 2010, with a chief complaint of shortness of breath and abdominal fullness.  He did not take his Lasix the day before presenting to the emergency department because he had decreased his overall fluid intake.  His daughter states that he eats a sausage biscuit every morning.  He is also fond of [place] sausages. In the emergency department, the patient was noted to be afebrile and tachycardic with a heart rate ranging from 100-125 beats per minute. His blood pressure was within normal limits at 127/90.  His BNP was elevated at 4832.  His chest x-ray revealed mild central pulmonary vascular congestion and small left pleural effusion.  His venous glucose was 262.  His EKG revealed atrial fibrillation with a heart rate of 125 beats per minute.  He was  admitted for further evaluation and management.  HOSPITAL COURSE: 1. ACUTE ON CHRONIC DECOMPENSATED SYSTOLIC CONGESTIVE HEART FAILURE,     LEFT PLEURAL EFFUSION, CORONARY ARTERY DISEASE, AND PAROXYSMAL     ATRIAL FIBRILLATION WITH RAPID VENTRICULAR RESPONSE.  The patient     was given 40 mg of Lasix intravenously in the emergency department.     He was subsequently started on 40 mg of Lasix IV q.12 h.  Cardizem     was started at 30 mg q.6 h.  He was maintained on all of his     chronic cardiac medications including ramipril, pravastatin,     metoprolol, and aspirin.  The patient's latest 2-D echocardiogram     in July 2011, revealed severe systolic dysfunction with an ejection     fraction of 10-15%.  He also has a history of paroxysmal atrial     fibrillation and had refused Coumadin in the past.  I did ask  him     again if he was willing to start Coumadin for anticoagulation, he     stated no.  He diuresed very well during the hospitalization.  He     became less and less symptomatic.  At the time of hospital     discharge, he was oxygenating 96-94% on 1 L of nasal cannula     oxygen.  His heart rate normalized.  His blood pressure remained     relatively low normal with a systolic blood pressure ranging from     100 up to 115.  His BNP decreased from [phone].  The exact     accuracy of his weight loss was somewhat suspect and, therefore,     will not be dictated.  Needless to say, he did diurese several     liters during the hospitalization.  Prior to discharge, the patient     was instructed to increase the dose of his home Lasix to 60 mg     b.i.d.  He was also instructed to increase potassium chloride     supplementation to 1-1/2 tablets daily rather than 1 tablet daily.     Diltiazem was continued at 30 mg b.i.d.  Further management will be     deferred to his primary care physician, Dr. Eden Emms or colleagues. 2. TYPE 2 DIABETES MELLITUS.  The patient's venous and capillary blood     glucose were relatively uncontrolled.  He was restarted on Lantus.     He had recently decreased the dose of Lantus from 65 units to 50     units at home due to several episodes of hypoglycemia.  However,     his hemoglobin A1c was 9.5.  During the hospital course, his     capillary blood glucose consistently ranged in the 200s.  The dose     of Lantus was increased.  He was started on sliding scale NovoLog.     He was instructed on a heart healthy diabetic diet for     review.  It was decided that he needed a higher dose of Lantus and,     therefore, he was instructed to increase his home Lantus dosing to     60 units at night.  In addition, he was instructed on sliding scale     NovoLog twice daily before meals.  The instructions were given to     him.  He voiced understanding.     Elliot Cousin,  M.D.     DF/MEDQ  D:  07/10/2010  T:  07/10/2010  Job:  [id number]  cc:   Kirk Ruths, M.D. Fax: [phone]  Noralyn Pick. Eden Emms, MD, [facility] [address]  [address] [place] [address]  Electronically Signed by Elliot Cousin M.D. on 07/13/2010 42:59:56 PM

## 2010-07-27 ENCOUNTER — Encounter: Payer: Self-pay | Admitting: Cardiovascular Disease

## 2010-07-28 ENCOUNTER — Ambulatory Visit (INDEPENDENT_AMBULATORY_CARE_PROVIDER_SITE_OTHER): Payer: Medicare Other | Admitting: Cardiovascular Disease

## 2010-07-28 ENCOUNTER — Encounter: Payer: Self-pay | Admitting: Cardiovascular Disease

## 2010-07-28 VITALS — BP 118/70 | HR 74 | Ht 72.0 in | Wt 213.5 lb

## 2010-07-28 DIAGNOSIS — E7800 Pure hypercholesterolemia, unspecified: Secondary | ICD-10-CM

## 2010-07-28 DIAGNOSIS — I10 Essential (primary) hypertension: Secondary | ICD-10-CM

## 2010-07-28 DIAGNOSIS — I2510 Atherosclerotic heart disease of native coronary artery without angina pectoris: Secondary | ICD-10-CM

## 2010-07-28 DIAGNOSIS — I428 Other cardiomyopathies: Secondary | ICD-10-CM

## 2010-07-28 DIAGNOSIS — I4891 Unspecified atrial fibrillation: Secondary | ICD-10-CM | POA: Insufficient documentation

## 2010-07-28 MED ORDER — RIVAROXABAN 20 MG PO TABS: 20.0000 mg | ORAL_TABLET | Freq: Every day | ORAL | Status: DC

## 2010-07-28 NOTE — Assessment & Plan Note (Signed)
Well controlled.  Continue current medications and low sodium Dash type diet.    

## 2010-07-28 NOTE — Assessment & Plan Note (Signed)
Recent CHF likely due to afib.  Diuretic added in hospital.  BMET and BNP next visit.

## 2010-07-28 NOTE — Patient Instructions (Signed)
Start Xarelto 20 mg daily  Your physician recommends that you schedule a follow-up appointment in: 3- 4 weeks (Thur 7/19 at 10am in [facility])

## 2010-07-28 NOTE — Assessment & Plan Note (Signed)
Rate control is good.  Start [place] as he will not get his INR checked.  Consider [facility] next few weeks if patient takes pill and willing to have it done.  He will think about it

## 2010-07-28 NOTE — Assessment & Plan Note (Signed)
Cholesterol is at goal.  Continue current dose of statin and diet Rx.  No myalgias or side effects.  F/U  LFT's in 6 months. Lab Results  Component Value Date   LDLCALC  Value: 104        Total Cholesterol/HDL:CHD Risk Coronary Heart Disease Risk Table                     Men   Women  1/2 Average Risk   3.4   3.3  Average Risk       5.0   4.4  2 X Average Risk   9.6   7.1  3 X Average Risk  23.4   11.0        Use the calculated Patient Ratio above and the CHD Risk Table to determine the patient's CHD Risk.        ATP III CLASSIFICATION (LDL):  <100     mg/dL   Optimal  100-129  mg/dL   Near or Above                    Optimal  130-159  mg/dL   Borderline  160-189  mg/dL   High  >190     mg/dL   Very High* 07/09/2010             

## 2010-07-28 NOTE — Progress Notes (Signed)
Phillip Henderson is seen today for F/U of ischemic DCM. His EF has ranged from 20-35%. He is funcitonal class one and has not wanted agressive w/u. I tried to get him to think about a biV AICD since he has had a large anterior MI in 1979 and undoubtedly has a large scar substrate. he has not wanted to have a cath or consideration for AICD or randomizaiton in the Determine Trial.   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   H[facility]alized last weekend at [facility] for [facility]F  Found to be in afib.  Records reviewed.  Was not started on anticoagulation.  Discussed progressive CHF with patient.  Afib rate control ok Suggested Xeralto if he did not want coumadin monitoring.  Would be beneficial to restore NSR to improve functional status.  He is willing to try.  Will discuss repeat cath and possible biV AICD after issues with anticoagulation and cardioversion resolved.    ROS: Denies fever, malais, weight loss, blurry vision, decreased visual acuity, cough, sputum,  hemoptysis, pleuritic pain, palpitaitons, heartburn, abdominal pain, melena, lower extremity edema, claudication, or rash.  All other systems reviewed and negative  Abdominal bloating and some discomfort  General: Affect appropriate Healthy:  appears stated age HEENT: normal Neck supple with no adenopathy JVP normal no bruits no thyromegaly Lungs clear with no wheezing and good diaphragmatic motion Heart:  S1/S2 no murmur,rub, gallop or click PMI normal Abdomen: benighn, BS positve, no tenderness, no AAA no bruit.  No HSM or HJR Distal pulses intact with no bruits No edema Neuro non-focal Skin warm and  dry No muscular weakness   Current Outpatient Prescriptions  Medication Sig Dispense Refill  . aspirin 81 MG EC tablet Take 81 mg by mouth daily.        Marland Kitchen diltiazem (CARDIZEM) 30 MG tablet Take 30 mg by mouth 2 (two) times daily.        . furosemide (LASIX) 40 MG tablet Take 60 mg by mouth 2 (two) times daily.        . insulin aspart (NOVOLOG) 100 UNIT/ML injection Inject into the skin 2 (two) times daily with a meal.        . insulin glargine (LANTUS) 100 UNIT/ML injection Inject 60 Units into the skin daily. 100 unit/mL?       Marland Kitchen metoprolol (LOPRESSOR) 100 MG tablet Take 50 mg by mouth 2 (two) times daily.        . potassium chloride (KLOR-CON) 10 MEQ CR tablet Take 15 mEq by mouth daily.        . pravastatin (PRAVACHOL) 20 MG tablet Take 20 mg by mouth at bedtime.        . ramipril (ALTACE) 10 MG capsule Take 20 mg by mouth daily.       Marland Kitchen senna (SENOKOT) 8.6 MG tablet Take 1 tablet by mouth 2 (two) times daily as needed.        Marland Kitchen DISCONTD: furosemide (LASIX) 40 MG tablet Take 40 mg by mouth 2 (two) times daily.        Marland Kitchen DISCONTD: metoprolol (LOPRESSOR) 100 MG tablet TAKE ONE TABLET BY MOUTH EVERY DAY  30 tablet  9  . DISCONTD: potassium chloride (KLOR-CON) 10 MEQ CR tablet Take 10 mEq by mouth daily.          Allergies  Phenergan  Electrocardiogram:  Afib rate 74 LAD LBBB  Assessment and Plan

## 2010-08-01 ENCOUNTER — Telehealth: Payer: Self-pay | Admitting: Cardiovascular Disease

## 2010-08-01 NOTE — Telephone Encounter (Signed)
Pt's dtr calling re xarelto given samples of last week during office visit , need rx to [facility] [place] needs pre auth first

## 2010-08-02 ENCOUNTER — Telehealth: Payer: Self-pay | Admitting: *Deleted

## 2010-08-02 NOTE — Telephone Encounter (Signed)
Per pt daughter calling wants to discuss next appt. C/o sob.

## 2010-08-02 NOTE — Telephone Encounter (Signed)
Daughter calling stating Phillip Henderson extremely SOB and wants to proceed with cardioversion ASAP--Advised i would forward message to dr Eden Emms and Stanton Kidney --daughter agrees--nt

## 2010-08-03 ENCOUNTER — Encounter: Payer: Self-pay | Admitting: *Deleted

## 2010-08-03 MED ORDER — DABIGATRAN ETEXILATE MESYLATE 150 MG PO CAPS: 150.0000 mg | ORAL_CAPSULE | Freq: Two times a day (BID) | ORAL | Status: DC

## 2010-08-03 NOTE — Telephone Encounter (Signed)
TEE cardioversion scheduled for 08-09-10 @ 1:30pm. Pt to arrive at short stay at 12:30pm. dtr aware of plan Deliah Goody

## 2010-08-03 NOTE — Telephone Encounter (Signed)
Spoke with pt dtr, she reports the pt has not started on the xeralto because of his insurance. Called and spoke with pts insurance, they will not approve xeralto unless the pt is unable to take pradaxa. Spoke with pts dtr again, she states her father is not doing well. He is unable to sleep, he is having a lot of trouble breathing and if something is not done soon he may not be here next week. She reports the pt has asked to be taken to the hosp. Explained to dtr, we could start the pradaxa but would be next week before any type of cardioversion could be done. Explained to her if the pt is feeling that bad going to the hosp would be the best thing to do so his atrial fib and SOB could be helped on a faster basis. She is going to discuss with her sister and father and let us know what they are going to do. Deliah Goody

## 2010-08-03 NOTE — Telephone Encounter (Signed)
Addended by: Freddi Starr on: 08/03/2010 11:49 AM   Modules accepted: Orders

## 2010-08-03 NOTE — Telephone Encounter (Addendum)
Spoke with pt dtr beth, they would like the script called to the pharm and want to go ahead and schedule a TEE cardioversion. They are trying to talk the pt into going to the hosp. Med changes and pt condition discussed with dr Jens Som. Deliah Goody

## 2010-08-08 ENCOUNTER — Telehealth: Payer: Self-pay | Admitting: Cardiovascular Disease

## 2010-08-08 NOTE — Telephone Encounter (Signed)
Pt daughter stating pt is waiting on a letter re what he needs to do before his procedure.

## 2010-08-08 NOTE — Telephone Encounter (Signed)
Spoke with pt dtr, instructions for TEE cardioversion discussed with dtr Deliah Goody

## 2010-08-09 ENCOUNTER — Telehealth: Payer: Self-pay | Admitting: Cardiovascular Disease

## 2010-08-09 ENCOUNTER — Other Ambulatory Visit: Payer: Self-pay | Admitting: Cardiovascular Disease

## 2010-08-09 ENCOUNTER — Ambulatory Visit (HOSPITAL_COMMUNITY)
Admission: RE | Admit: 2010-08-09 | Discharge: 2010-08-09 | Disposition: A | Discharge status: Home / Self Care | Payer: Medicare Other | Source: Ambulatory Visit | Attending: Cardiovascular Disease | Admitting: Cardiovascular Disease

## 2010-08-09 DIAGNOSIS — Z01812 Encounter for preprocedural laboratory examination: Secondary | ICD-10-CM | POA: Insufficient documentation

## 2010-08-09 DIAGNOSIS — I4891 Unspecified atrial fibrillation: Secondary | ICD-10-CM

## 2010-08-09 DIAGNOSIS — I2510 Atherosclerotic heart disease of native coronary artery without angina pectoris: Secondary | ICD-10-CM | POA: Insufficient documentation

## 2010-08-09 DIAGNOSIS — I2589 Other forms of chronic ischemic heart disease: Secondary | ICD-10-CM | POA: Insufficient documentation

## 2010-08-09 DIAGNOSIS — I509 Heart failure, unspecified: Secondary | ICD-10-CM | POA: Insufficient documentation

## 2010-08-09 DIAGNOSIS — Z7901 Long term (current) use of anticoagulants: Secondary | ICD-10-CM | POA: Insufficient documentation

## 2010-08-09 DIAGNOSIS — I059 Rheumatic mitral valve disease, unspecified: Secondary | ICD-10-CM | POA: Insufficient documentation

## 2010-08-09 DIAGNOSIS — Z951 Presence of aortocoronary bypass graft: Secondary | ICD-10-CM | POA: Insufficient documentation

## 2010-08-09 LAB — GLUCOSE, CAPILLARY: Glucose-Capillary: 244 mg/dL — ABNORMAL HIGH (ref 70–99)

## 2010-08-09 MED ORDER — AMIODARONE HCL 400 MG PO TABS: 400.0000 mg | ORAL_TABLET | Freq: Two times a day (BID) | ORAL | Status: DC

## 2010-08-09 NOTE — Telephone Encounter (Addendum)
Pt's dtr calling to see if the med we called in to [facility], amiodarone can be called into [facility] because they have to order it and he's suppose to start it today

## 2010-08-09 NOTE — Telephone Encounter (Signed)
Sent to [facility].  Judithe Modest, CMA

## 2010-08-16 NOTE — Consult Note (Signed)
  NAMEMarland Henderson  Phillip, Henderson NO.:  [id number]  MEDICAL RECORD NO.:  [id number]  LOCATION:  [facility]                         FACILITY:  [facility]  PHYSICIAN:  Noralyn Pick. Eden Emms, MD, FACCDATE OF BIRTH:  May 17, 1934  DATE OF CONSULTATION: DATE OF DISCHARGE:                                TEE/DCC   INDICATIONS:  A 76 year old patient distant history of an anterior MI in the 70s, coronary artery bypass surgery in 2001, severe ischemic cardiomyopathy with recent hospitalization at [facility] at the end of June for congestive heart failure found to be in atrial fibrillation at that time, currently on Pradaxa, the patient confirms taking the pill twice a day.  Transesophageal echo with cardioversion indicated for benefit of congestive heart failure.  Prior to the procedure, the patient was in AFib at a rate of 110.  He was given 50 mcg of fentanyl and 4 mg of Versed.  Using digital technique, an Omniplane probe was advanced into the esophagus without incident.  Left jugular cavity size and function were abnormal.  He had severe left jugular cavity dilatation with global hypokinesis and an EF of 20-25%.  There is no mural apical thrombus.  Mitral valve was mildly thickened with mild MR and there was moderate left atrial enlargement. There was significant spontaneous contrast in the left atrium and left atrial appendage but no formed clot.  Atrial septum was intact.  There was mild right atrial enlargement, right ventricle was normal, aortic valve was trileaflet and calcified but no stenosis or regurgitation. Ascending aortic root was normal.  Imaging of the aorta showed no significant debris.  The left atrial appendage was well visualized in orthogonal views since there was no formed thrombus.  We decided to proceed with DC cardioversion.  We had good sedation with fentanyl and Versed.  I elected to shock him once in a biphasic manner synchronized at 200 joules.  Initially, he  did not convert.  We were then going to wait for anesthesia, but after about 10 minutes he converted to normal sinus rhythm at a rate of 77.  IMPRESSION:  Successful TEE-guided cardioversion no immediate neurological sequela.  The patient has been noncompliant in the past.  I spoke directly to multiple family members and told him how important it was for him to take his Pradaxa b.i.d.  I will also call in amiodarone 400 b.i.d. to try to maintain his normal sinus rhythm since his left ventricular function is so poor.  He already has a follow-up appointment with me on August 25, 2010.  He appears to have tolerated the procedure well.    Noralyn Pick. Eden Emms, MD, [facility]    PCN/MEDQ  D:  08/09/2010  T:  08/10/2010  Job:  [id number]  Electronically Signed by Charlton Haws MD [facility] on 08/16/2010 12:54:14 PM

## 2010-08-18 ENCOUNTER — Encounter: Payer: Self-pay | Admitting: Cardiovascular Disease

## 2010-08-25 ENCOUNTER — Encounter: Payer: Self-pay | Admitting: Cardiovascular Disease

## 2010-08-25 ENCOUNTER — Ambulatory Visit (INDEPENDENT_AMBULATORY_CARE_PROVIDER_SITE_OTHER): Payer: Medicare Other | Admitting: Cardiovascular Disease

## 2010-08-25 DIAGNOSIS — I4891 Unspecified atrial fibrillation: Secondary | ICD-10-CM

## 2010-08-25 DIAGNOSIS — E7800 Pure hypercholesterolemia, unspecified: Secondary | ICD-10-CM

## 2010-08-25 MED ORDER — AMIODARONE HCL 200 MG PO TABS: 200.0000 mg | ORAL_TABLET | Freq: Two times a day (BID) | ORAL | Status: DC

## 2010-08-25 NOTE — Assessment & Plan Note (Signed)
Stable with no angina and good activity level.  Continue medical Rx Need to continue Pradaxa at least 4 weeks post [facility]  Consdier cath Discussion next visit but he has not wanted invasive evaluation before

## 2010-08-25 NOTE — Assessment & Plan Note (Signed)
S/P TEE/DCC 08/09/10  Decrease amiodarone to 200 bid and hopefully 200/day in 3 months.   Continue Pradaxa

## 2010-08-25 NOTE — Assessment & Plan Note (Signed)
Cholesterol is at goal.  Continue current dose of statin and diet Rx.  No myalgias or side effects.  F/U  LFT's in 6 months. Lab Results  Component Value Date   LDLCALC  Value: 104        Total Cholesterol/HDL:CHD Risk Coronary Heart Disease Risk Table                     Men   Women  1/2 Average Risk   3.4   3.3  Average Risk       5.0   4.4  2 X Average Risk   9.6   7.1  3 X Average Risk  23.4   11.0        Use the calculated Patient Ratio above and the CHD Risk Table to determine the patient's CHD Risk.        ATP III CLASSIFICATION (LDL):  <100     mg/dL   Optimal  100-129  mg/dL   Near or Above                    Optimal  130-159  mg/dL   Borderline  160-189  mg/dL   High  >190     mg/dL   Very High* 07/09/2010             

## 2010-08-25 NOTE — Patient Instructions (Signed)
Your physician has recommended you make the following change in your medication: Decrease Amlodipine to 200 mg twice daily  Your physician recommends that you schedule a follow-up appointment in: 3 months

## 2010-08-25 NOTE — Progress Notes (Signed)
Phillip Henderson is seen today for F/U of ischemic DCM. His EF has ranged from 20-35%. He is funcitonal class two and has not wanted agressive w/u. I tried to get him to think about a biV AICD since he has had a large anterior MI in 1979 and undoubtedly has a large scar substrate. he has not wanted to have a cath or consideration for AICD or randomizaiton in the Determine Trial. 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  Hosptalized in May  at [facility] for [facility]F Found to be in afib.  Subsequently anticoagulated and had TEE/DCC on July 3rd.  Has been on amiodarone 400 bid to help maintain NSR  ROS: Denies fever, malais, weight loss, blurry vision, decreased visual acuity, cough, sputum, SOB, hemoptysis, pleuritic pain, palpitaitons, heartburn, abdominal pain, melena, lower extremity edema, claudication, or rash.  All other systems reviewed and negative  General: Affect appropriate Healthy:  appears stated age HEENT: normal Neck supple with no adenopathy JVP normal no bruits no thyromegaly Lungs clear with no wheezing and good diaphragmatic motion Heart:  S1/S2 no murmur,rub, gallop or click PMI normal Abdomen: benighn, BS positve, no tenderness, no AAA no bruit.  No HSM or HJR Distal pulses intact with no bruits No edema Neuro non-focal Skin warm and dry No muscular weakness   Current Outpatient Prescriptions  Medication Sig Dispense Refill  . amiodarone (PACERONE) 400 MG tablet Take 1 tablet (400 mg total) by mouth 2 (two) times daily.  60 tablet  6  . dabigatran (PRADAXA) 150 MG CAPS Take 1 capsule (150 mg total) by mouth every 12 (twelve) hours.  60  capsule  6  . diltiazem (CARDIZEM) 30 MG tablet Take 30 mg by mouth 2 (two) times daily.        . furosemide (LASIX) 40 MG tablet Take 60 mg by mouth 2 (two) times daily.        . insulin aspart (NOVOLOG) 100 UNIT/ML injection Inject into the skin 2 (two) times daily with a meal.        . insulin glargine (LANTUS) 100 UNIT/ML injection Inject 60 Units into the skin daily. 100 unit/mL?       Marland Kitchen metoprolol (LOPRESSOR) 100 MG tablet Take 50 mg by mouth 2 (two) times daily.        . potassium chloride (KLOR-CON) 10 MEQ CR tablet Take 15 mEq by mouth daily.        . pravastatin (PRAVACHOL) 20 MG tablet Take 20 mg by mouth at bedtime.        . ramipril (ALTACE) 10 MG capsule Take 20 mg by mouth daily.       Marland Kitchen senna (SENOKOT) 8.6 MG tablet Take 1 tablet by mouth 2 (two) times daily as needed.          Allergies  Phenergan  Electrocardiogram:  NSR 50 PR 230 LAD LBBB  Assessment and Plan

## 2010-08-25 NOTE — Assessment & Plan Note (Signed)
Improved now that he is in NSR.  Continue current meds.  If he relapses can cosider consult [facility] for biV AICD

## 2010-09-09 ENCOUNTER — Other Ambulatory Visit: Payer: Self-pay | Admitting: Cardiovascular Disease

## 2010-09-09 MED ORDER — DILTIAZEM HCL 30 MG PO TABS: 30.0000 mg | ORAL_TABLET | Freq: Two times a day (BID) | ORAL | Status: DC

## 2010-09-09 NOTE — Telephone Encounter (Signed)
Please call RX refill called into [facility].

## 2010-11-08 ENCOUNTER — Ambulatory Visit (INDEPENDENT_AMBULATORY_CARE_PROVIDER_SITE_OTHER): Payer: Medicare Other | Admitting: Cardiovascular Disease

## 2010-11-08 ENCOUNTER — Encounter: Payer: Self-pay | Admitting: Cardiovascular Disease

## 2010-11-08 ENCOUNTER — Other Ambulatory Visit: Payer: Self-pay

## 2010-11-08 DIAGNOSIS — R0602 Shortness of breath: Secondary | ICD-10-CM

## 2010-11-08 DIAGNOSIS — R609 Edema, unspecified: Secondary | ICD-10-CM

## 2010-11-08 DIAGNOSIS — I10 Essential (primary) hypertension: Secondary | ICD-10-CM

## 2010-11-08 DIAGNOSIS — Z7901 Long term (current) use of anticoagulants: Secondary | ICD-10-CM | POA: Insufficient documentation

## 2010-11-08 DIAGNOSIS — I4891 Unspecified atrial fibrillation: Secondary | ICD-10-CM

## 2010-11-08 DIAGNOSIS — I428 Other cardiomyopathies: Secondary | ICD-10-CM

## 2010-11-08 DIAGNOSIS — I2510 Atherosclerotic heart disease of native coronary artery without angina pectoris: Secondary | ICD-10-CM

## 2010-11-08 DIAGNOSIS — Z5181 Encounter for therapeutic drug level monitoring: Secondary | ICD-10-CM

## 2010-11-08 DIAGNOSIS — E7800 Pure hypercholesterolemia, unspecified: Secondary | ICD-10-CM

## 2010-11-08 DIAGNOSIS — I509 Heart failure, unspecified: Secondary | ICD-10-CM

## 2010-11-08 LAB — BASIC METABOLIC PANEL
Glucose, Bld: 83 mg/dL (ref 70–99)
Potassium: 3.9 mEq/L (ref 3.5–5.3)
Sodium: 139 mEq/L (ref 135–145)

## 2010-11-08 LAB — TSH: TSH: 0.956 u[IU]/mL (ref 0.350–4.500)

## 2010-11-08 NOTE — Assessment & Plan Note (Signed)
Stable with no angina and good activity level.  Continue medical Rx  

## 2010-11-08 NOTE — Assessment & Plan Note (Addendum)
Maint NSR  No palpitations Decrease amiodarone to 200 mg daily

## 2010-11-08 NOTE — Patient Instructions (Signed)
Your physician recommends that you return for lab work in: today  Your physician recommends that you schedule a follow-up appointment in: 6 months   

## 2010-11-08 NOTE — Assessment & Plan Note (Signed)
3 months post TEE.DCC  D/C Pradaxa.  Maint NSR  Does not like being on "strong" anticoagulant

## 2010-11-08 NOTE — Assessment & Plan Note (Signed)
Functional class 2 Euvolemic.  Continue current Rx

## 2010-11-08 NOTE — Progress Notes (Signed)
Phillip Henderson is seen today for F/U of ischemic DCM. His EF has ranged from 20-35%. He is funcitonal class two and has not wanted agressive w/u. I tried to get him to think about a biV AICD since he has had a large anterior MI in 1979 and undoubtedly has a large scar substrate. he has not wanted to have a cath or consideration for AICD or randomizaiton in the Determine Trial. 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  Hosptalized in May at [facility] for CHF Found to be in afib. Subsequently anticoagulated and had TEE/DCC on July 3rd. Has been on amiodarone 400 bid to help maintain NSR  ROS: Denies fever, malais, weight loss, blurry vision, decreased visual acuity, cough, sputum, SOB, hemoptysis, pleuritic pain, palpitaitons, heartburn, abdominal pain, melena, lower extremity edema, claudication, or rash.  All other systems reviewed and negative  General: Affect appropriate Healthy:  appears stated age HEENT: normal Neck supple with no adenopathy JVP normal no bruits no thyromegaly Lungs clear with no wheezing and good diaphragmatic motion Heart:  S1/S2 no murmur,rub, gallop or click PMI normal Abdomen: benighn, BS positve, no tenderness, no AAA no bruit.  No HSM or HJR Distal pulses intact with no bruits No edema Neuro non-focal Skin warm and dry No muscular weakness   Current Outpatient Prescriptions  Medication Sig Dispense Refill  . amiodarone (PACERONE) 200 MG tablet Take 1 tablet (200 mg total) by mouth 2 (two) times daily.  60 tablet  3  . dabigatran (PRADAXA) 150 MG CAPS Take 1 capsule (150 mg total) by mouth every 12 (twelve) hours.  60 capsule   6  . diltiazem (CARDIZEM) 30 MG tablet Take 1 tablet (30 mg total) by mouth 2 (two) times daily.  60 tablet  6  . furosemide (LASIX) 40 MG tablet Take 60 mg by mouth 2 (two) times daily.        . insulin aspart (NOVOLOG) 100 UNIT/ML injection Inject into the skin 2 (two) times daily with a meal.        . insulin glargine (LANTUS) 100 UNIT/ML injection Inject 60 Units into the skin daily. 100 unit/mL?       Marland Kitchen metoprolol (LOPRESSOR) 100 MG tablet Take 50 mg by mouth 2 (two) times daily.        . potassium chloride (KLOR-CON) 10 MEQ CR tablet Take 15 mEq by mouth daily.        . pravastatin (PRAVACHOL) 20 MG tablet Take 20 mg by mouth at bedtime.        . ramipril (ALTACE) 10 MG capsule Take 20 mg by mouth daily.       Marland Kitchen senna (SENOKOT) 8.6 MG tablet Take 1 tablet by mouth 2 (two) times daily as needed.          Allergies  Phenergan  Electrocardiogram:  Assessment and Plan

## 2010-11-08 NOTE — Assessment & Plan Note (Signed)
Cholesterol is at goal.  Continue current dose of statin and diet Rx.  No myalgias or side effects.  F/U  LFT's in 6 months. Lab Results  Component Value Date   LDLCALC  Value: 104        Total Cholesterol/HDL:CHD Risk Coronary Heart Disease Risk Table                     Men   Women  1/2 Average Risk   3.4   3.3  Average Risk       5.0   4.4  2 X Average Risk   9.6   7.1  3 X Average Risk  23.4   11.0        Use the calculated Patient Ratio above and the CHD Risk Table to determine the patient's CHD Risk.        ATP III CLASSIFICATION (LDL):  <100     mg/dL   Optimal  100-129  mg/dL   Near or Above                    Optimal  130-159  mg/dL   Borderline  160-189  mg/dL   High  >190     mg/dL   Very High* 07/09/2010             

## 2010-11-09 ENCOUNTER — Other Ambulatory Visit: Payer: Self-pay

## 2010-11-09 MED ORDER — CLOPIDOGREL BISULFATE 75 MG PO TABS: 75.0000 mg | ORAL_TABLET | Freq: Every day | ORAL | Status: DC

## 2010-11-09 MED ORDER — FUROSEMIDE 40 MG PO TABS: 60.0000 mg | ORAL_TABLET | Freq: Two times a day (BID) | ORAL | Status: DC

## 2010-11-14 ENCOUNTER — Telehealth: Payer: Self-pay | Admitting: Cardiology

## 2010-11-14 NOTE — Telephone Encounter (Signed)
Patient's daughter wants to make sure patient does not have to have any labwork done. States that he received letter to have labwork done the day after he had already had the labwork done. / tg

## 2010-12-21 ENCOUNTER — Other Ambulatory Visit: Payer: Self-pay | Admitting: Cardiovascular Disease

## 2010-12-22 LAB — BASIC METABOLIC PANEL
CO2: 28 mEq/L (ref 19–32)
Chloride: 100 mEq/L (ref 96–112)
Potassium: 4 mEq/L (ref 3.5–5.3)
Sodium: 139 mEq/L (ref 135–145)

## 2010-12-22 LAB — BRAIN NATRIURETIC PEPTIDE: Brain Natriuretic Peptide: 159.8 pg/mL — ABNORMAL HIGH (ref 0.0–100.0)

## 2011-01-01 ENCOUNTER — Other Ambulatory Visit: Payer: Self-pay | Admitting: Cardiovascular Disease

## 2011-02-20 ENCOUNTER — Other Ambulatory Visit: Payer: Self-pay

## 2011-02-20 ENCOUNTER — Other Ambulatory Visit: Payer: Self-pay | Admitting: Cardiovascular Disease

## 2011-02-20 MED ORDER — DABIGATRAN ETEXILATE MESYLATE 150 MG PO CAPS: 150.0000 mg | ORAL_CAPSULE | Freq: Two times a day (BID) | ORAL | Status: DC

## 2011-03-09 ENCOUNTER — Other Ambulatory Visit: Payer: Self-pay | Admitting: Cardiovascular Disease

## 2011-03-31 ENCOUNTER — Other Ambulatory Visit: Payer: Self-pay | Admitting: Cardiovascular Disease

## 2011-04-12 ENCOUNTER — Ambulatory Visit (INDEPENDENT_AMBULATORY_CARE_PROVIDER_SITE_OTHER): Payer: 59 | Admitting: Cardiovascular Disease

## 2011-04-12 ENCOUNTER — Telehealth: Payer: Self-pay | Admitting: *Deleted

## 2011-04-12 DIAGNOSIS — Z7901 Long term (current) use of anticoagulants: Secondary | ICD-10-CM

## 2011-04-12 DIAGNOSIS — I10 Essential (primary) hypertension: Secondary | ICD-10-CM

## 2011-04-12 DIAGNOSIS — I4891 Unspecified atrial fibrillation: Secondary | ICD-10-CM

## 2011-04-12 DIAGNOSIS — E119 Type 2 diabetes mellitus without complications: Secondary | ICD-10-CM

## 2011-04-12 DIAGNOSIS — I509 Heart failure, unspecified: Secondary | ICD-10-CM

## 2011-04-12 DIAGNOSIS — Z5181 Encounter for therapeutic drug level monitoring: Secondary | ICD-10-CM

## 2011-04-12 DIAGNOSIS — E7800 Pure hypercholesterolemia, unspecified: Secondary | ICD-10-CM

## 2011-04-12 NOTE — Telephone Encounter (Signed)
Patient pre certified for Pradaxa 150 mg twice daily via [facility] for 1 year from today and will expire 04/11/2012.  PID# [id number]

## 2011-04-12 NOTE — Assessment & Plan Note (Signed)
Discussed low carb diet.  Target hemoglobin A1c is 6.5 or less.  Continue current medications.  

## 2011-04-12 NOTE — Assessment & Plan Note (Addendum)
Maint NSR no palpitatons  Continue Pradaxa

## 2011-04-12 NOTE — Assessment & Plan Note (Signed)
Euvolemic doing much better since maint NSR

## 2011-04-12 NOTE — Patient Instructions (Signed)
**Note De-identified Phillip Henderson Obfuscation** Your physician recommends that you schedule a follow-up appointment in: 6 months  

## 2011-04-12 NOTE — Assessment & Plan Note (Signed)
Cholesterol is at goal.  Continue current dose of statin and diet Rx.  No myalgias or side effects.  F/U  LFT's in 6 months. Lab Results  Component Value Date   LDLCALC  Value: 104        Total Cholesterol/HDL:CHD Risk Coronary Heart Disease Risk Table                     Men   Women  1/2 Average Risk   3.4   3.3  Average Risk       5.0   4.4  2 X Average Risk   9.6   7.1  3 X Average Risk  23.4   11.0        Use the calculated Patient Ratio above and the CHD Risk Table to determine the patient's CHD Risk.        ATP III CLASSIFICATION (LDL):  <100     mg/dL   Optimal  100-129  mg/dL   Near or Above                    Optimal  130-159  mg/dL   Borderline  160-189  mg/dL   High  >190     mg/dL   Very High* 07/09/2010             

## 2011-04-12 NOTE — Progress Notes (Signed)
Phillip Henderson is seen today for F/U of ischemic DCM. His EF has ranged from 20-35%. He is funcitonal class two and has not wanted agressive w/u. I tried to get him to think about a biV AICD since he has had a large anterior MI in 1979 and undoubtedly has a large scar substrate. he has not wanted to have a cath or consideration for AICD or randomizaiton in the Determine Trial. Previous anterior MI in late 90's    CABG in 2001  Coronary artery bypass grafting x 5 with the left  internal mammary artery to the first diagonal coronary artery, sequential  saphenous vein graft to the distal left anterior descending, reversed  saphenous vein graft to the obtuse marginal, sequential saphenous vein graft  to the proximal posterior descending and distal posterior descending.  Hosptalized in May at [facility] for CHF Found to be in afib. Subsequently anticoagulated and had TEE/DCC on July 3rd. Has been on amiodarone 400 bid to help maintain NSR  ROS: Denies fever, malais, weight loss, blurry vision, decreased visual acuity, cough, sputum, SOB, hemoptysis, pleuritic pain, palpitaitons, heartburn, abdominal pain, melena, lower extremity edema, claudication, or rash.  All other systems reviewed and negative  General: Affect appropriate Healthy:  appears stated age HEENT: normal Neck supple with no adenopathy JVP normal no bruits no thyromegaly Lungs clear with no wheezing and good diaphragmatic motion Heart:  S1/S2 no murmur, no rub, gallop or click PMI normal Abdomen: benighn, BS positve, no tenderness, no AAA no bruit.  No HSM or HJR Distal pulses intact with no bruits No edema Neuro non-focal Skin warm and dry No muscular weakness   Current Outpatient Prescriptions  Medication Sig Dispense Refill  . betamethasone dipropionate (DIPROLENE) 0.05 % cream       . clopidogrel (PLAVIX) 75 MG tablet TAKE ONE TABLET BY MOUTH EVERY DAY  90 tablet  2  . dabigatran (PRADAXA) 150 MG CAPS Take 1 capsule (150 mg total) by  mouth every 12 (twelve) hours.  60 capsule  4  . diltiazem (CARDIZEM) 30 MG tablet TAKE 1 TABLET (30 MG TOTAL) BY MOUTH 2 (TWO) TIMES DAILY.  60 tablet  5  . furosemide (LASIX) 40 MG tablet TAKE 1 & 1/2 TABLETS BY MOUTH TWICE DAILY  90 tablet  2  . insulin aspart (NOVOLOG) 100 UNIT/ML injection Inject into the skin 2 (two) times daily with a meal.        . insulin glargine (LANTUS) 100 UNIT/ML injection Inject 60 Units into the skin daily. 100 unit/mL?       Marland Kitchen KLOR-CON M10 10 MEQ tablet       . meloxicam (MOBIC) 15 MG tablet       . metoprolol (LOPRESSOR) 100 MG tablet Take 50 mg by mouth 2 (two) times daily.        Marland Kitchen PACERONE 200 MG tablet TAKE ONE TABLET BY MOUTH TWICE DAILY  60 each  12  . potassium chloride (KLOR-CON) 10 MEQ CR tablet Take 15 mEq by mouth daily.        . pravastatin (PRAVACHOL) 20 MG tablet Take 40 mg by mouth at bedtime.       . ramipril (ALTACE) 10 MG capsule Take 20 mg by mouth daily.       Marland Kitchen senna (SENOKOT) 8.6 MG tablet Take 1 tablet by mouth 2 (two) times daily as needed.          Allergies  Phenergan  Electrocardiogram:  Assessment and Plan

## 2011-04-12 NOTE — Assessment & Plan Note (Signed)
Well controlled.  Continue current medications and low sodium Dash type diet.    

## 2011-04-12 NOTE — Assessment & Plan Note (Signed)
Check CBC and BMET  No bleeding problems

## 2011-05-28 ENCOUNTER — Other Ambulatory Visit: Payer: Self-pay | Admitting: Cardiovascular Disease

## 2011-06-08 ENCOUNTER — Other Ambulatory Visit: Payer: Self-pay | Admitting: Cardiovascular Disease

## 2011-06-30 ENCOUNTER — Telehealth: Payer: Self-pay | Admitting: *Deleted

## 2011-06-30 ENCOUNTER — Other Ambulatory Visit: Payer: Self-pay | Admitting: *Deleted

## 2011-06-30 ENCOUNTER — Telehealth: Payer: Self-pay | Admitting: Cardiovascular Disease

## 2011-06-30 DIAGNOSIS — Z7901 Long term (current) use of anticoagulants: Secondary | ICD-10-CM

## 2011-06-30 NOTE — Telephone Encounter (Addendum)
Received TFC from patient today stating that he picked up a prescription for Plavix today.  Is confused as to what medication he is to be taking.  Upon reviewing his chart, Patient had a DCCV in July of 2012, at which time he was on Pradaxa.  He had a follow up visit with Dr Eden Emms in October, who noted that he was in NSR and Pradaxa was to be discontinued, however was not removed from medication list and a new prescription for Plavix was inadvertently generated by CMA and discontinued on the same day.  When he was seen for follow up in March, Plavix and Pradaxa were both on his medication list.  Advised patient to come in today for a nurse visit.  Ekg completed and is in normal sinus rhythm.  Patient states that he picked Plavix up due to refill notifications from the pharmacy and states he has taken it at interval before, starting in January. Dr Diona Browner consulted to interpret EKG and Orma Flaming D, for consultation regarding medication.  Patient advised not to take any further Plavix and continue Pradaxa until he received a return phone call.  Will route note to Dr Eden Emms for final decision regarding discontinuation of Pradaxa.

## 2011-06-30 NOTE — Telephone Encounter (Signed)
Patient's daughter has medication concerns. States patient went to pick meds up from pharmacy and was told two of his meds he could not take together. / tg

## 2011-06-30 NOTE — Telephone Encounter (Signed)
**Note De-Identified Phillip Henderson Obfuscation** Pt. coming to office today for EKG and medication update./LV

## 2011-06-30 NOTE — Telephone Encounter (Signed)
**Note De-identified Phillip Henderson Obfuscation** LMOM./LV 

## 2011-07-01 NOTE — Telephone Encounter (Signed)
Continue pradaxa and stop plavix

## 2011-07-04 NOTE — Telephone Encounter (Signed)
Patient notified of recommendations from Dr Eden Emms.  6 month recall scheduled from March visit.

## 2011-09-17 ENCOUNTER — Other Ambulatory Visit: Payer: Self-pay | Admitting: Cardiovascular Disease

## 2011-10-06 ENCOUNTER — Other Ambulatory Visit: Payer: Self-pay | Admitting: Cardiovascular Disease

## 2011-10-22 ENCOUNTER — Other Ambulatory Visit: Payer: Self-pay | Admitting: Cardiovascular Disease

## 2011-10-24 ENCOUNTER — Ambulatory Visit (INDEPENDENT_AMBULATORY_CARE_PROVIDER_SITE_OTHER): Payer: 59 | Admitting: Adult Health

## 2011-10-24 ENCOUNTER — Encounter: Payer: Self-pay | Admitting: Adult Health

## 2011-10-24 VITALS — BP 140/70 | HR 51 | Ht 72.0 in | Wt 215.0 lb

## 2011-10-24 DIAGNOSIS — I4891 Unspecified atrial fibrillation: Secondary | ICD-10-CM

## 2011-10-24 DIAGNOSIS — I428 Other cardiomyopathies: Secondary | ICD-10-CM

## 2011-10-24 MED ORDER — CLOPIDOGREL BISULFATE 75 MG PO TABS: 75.0000 mg | ORAL_TABLET | Freq: Every day | ORAL | Status: DC

## 2011-10-24 NOTE — Assessment & Plan Note (Signed)
He is on a slow atrial fibrillation on Pacerone 200 mg twice a day and Cardizem 30 mg twice a day along with Lopressor 50 mg twice a day. He is completely asymptomatic with low heart rate. He is requesting a change in Pradaxa and return to Plavix as his insurance company is not paying his prescription coverage for the former. I have changed him back to clopidogrel 75 mg daily with a 90 day supply. He will followup with Dr. Winn Jock in 6 months unless he becomes symptomatic. May need to consider decreasing beta blocker or Pacerone dosage at that time if his heart rate becomes more bradycardic, or he is symptomatic. At this time we will make no changes.

## 2011-10-24 NOTE — Assessment & Plan Note (Signed)
The patient is asymptomatic. He denies any presyncope palpitations or significant fatigue. His weight is unchanged since being seen in March of 2013. He is medically compliant. Labs are being drawn by his primary care physician in July of 2013. Labs are all within normal limits with a mildly elevated creatinine of 1.62. LFTs are normal limits. He has not found to be anemic hemoglobin is 14.2 with hematocrit of 41.9. Cholesterol status is also evaluated with a total cholesterol 138 triglycerides of 62 LDL of 97.

## 2011-10-24 NOTE — Progress Notes (Signed)
HPI: Phillip Henderson is a 77-year-old patient of Phillip Henderson admission with known history of ischemic dilated cardiomyopathy, most recent ejection fraction measured at 20-35%, CABG, atrial fibrillation ,and hypercholesterolemia. The patient was last seen by Phillip Henderson in March of 2013, and he recommended a by the AICD and possible repeat cardiac catheterization. The patient refuses any invasive testing or therapy. He wishes to be treated medically only. He also a wishes to stop Pradaxa, as this is too expensive for him and his insurance does not over this period. He does not wish to be on Coumadin. He is requesting to be placed back on clopidogrel. The patient is without complaints of chest discomfort dyspnea on exertion, edema, rapid heart rhythm, or presyncope. [place] [facility] [place] [facility] [place] and [place]s, or uses a stair stepper. He states that his energy level is about the same for him.   Allergies  Allergen Reactions  . Promethazine Hcl     Current Outpatient Prescriptions  Medication Sig Dispense Refill  . betamethasone dipropionate (DIPROLENE) 0.05 % cream Apply 1 application topically 2 (two) times daily as needed.       . clopidogrel (PLAVIX) 75 MG tablet Take 1 tablet (75 mg total) by mouth daily. Stop pradaxa  90 tablet  3  . diltiazem (CARDIZEM) 30 MG tablet TAKE 1 TABLET (30 MG TOTAL) BY MOUTH 2 (TWO) TIMES DAILY.  30 tablet  4  . furosemide (LASIX) 40 MG tablet TAKE 1 & 1/2 TABLETS BY MOUTH TWICE DAILY  90 tablet  3  . insulin glargine (LANTUS) 100 UNIT/ML injection Inject 60-64 Units into the skin at bedtime.       . meloxicam (MOBIC) 15 MG tablet Take 15 mg by mouth daily.       . metoprolol (LOPRESSOR) 100 MG tablet Take 50 mg by mouth 2 (two) times daily.        Marland Kitchen PACERONE 200 MG tablet TAKE ONE TABLET BY MOUTH TWICE DAILY  60 each  12  . potassium chloride (KLOR-CON) 10 MEQ CR tablet Take 15 mEq by mouth daily.        . pravastatin (PRAVACHOL) 20 MG tablet Take 40 mg by mouth  at bedtime.       . ramipril (ALTACE) 10 MG capsule Take 20 mg by mouth daily.       Marland Kitchen senna (SENOKOT) 8.6 MG tablet Take 1 tablet by mouth 2 (two) times daily as needed.        Marland Kitchen DISCONTD: metoprolol (LOPRESSOR) 100 MG tablet TAKE ONE TABLET BY MOUTH EVERY DAY  30 tablet  9    Past Medical History  Diagnosis Date  . CAD (coronary artery disease)     ant MI 64. CABG 2001 [place] D1 SVG LAD SVG om SVG PDA EF 30%  . CHF (congestive heart failure)   . Diabetes mellitus     mellitus? lantus at night  . Hyperlipidemia      WUJ:WJXBJY of systems complete and found to be negative unless listed above  PHYSICAL EXAM BP 140/70  Pulse 51  Ht 6' (1.829 m)  Wt 215 lb (97.523 kg)  BMI 29.16 kg/m2  General: Well developed, well nourished, in no acute distress Head: Eyes PERRLA, No xanthomas.   Normal cephalic and atraumatic Lungs: Clear bilaterally to auscultation and percussion. Heart: HRRR S1 S2, without MRG.  Pulses are 2+ & equal.            No carotid bruit.  No JVD.  No abdominal bruits. No femoral bruits. Abdomen: Bowel sounds are positive, abdomen soft and non-tender without masses or  Hernia's noted. Msk:  Back normal, normal gait. Normal strength and tone for age. Extremities: No clubbing, cyanosis or edema.  DP +1 Neuro: Alert and oriented X 3. Psych:  Good affect, responds appropriately  WUJ:WJXBJ bradycardia with a LBBB, afib with slow ventricular response.Rate of 51 bpm.  ASSESSMENT AND PLAN

## 2011-10-24 NOTE — Patient Instructions (Addendum)
Your physician recommends that you schedule a follow-up appointment in: 6 months with Dr. Eden Emms. You will receive a reminder letter in the  mail in about 4 months reminding you to call and schedule your appointment. If you don't receive this letter, please contact our office.  Your physician has recommended you make the following change in your medication: Stop pradaxa and restart your plavix (clopidogrel)75 mg daily starting tomorrow. Your new prescription has been sent to your pharmacy. All other medications will remain the same.

## 2012-03-14 ENCOUNTER — Ambulatory Visit (INDEPENDENT_AMBULATORY_CARE_PROVIDER_SITE_OTHER): Payer: PRIVATE HEALTH INSURANCE | Admitting: Adult Health

## 2012-03-14 ENCOUNTER — Telehealth: Payer: Self-pay | Admitting: Adult Health

## 2012-03-14 ENCOUNTER — Ambulatory Visit (HOSPITAL_COMMUNITY)
Admission: RE | Admit: 2012-03-14 | Discharge: 2012-03-14 | Disposition: A | Discharge status: Home / Self Care | Payer: Medicare Other | Source: Ambulatory Visit | Attending: Adult Health | Admitting: Adult Health

## 2012-03-14 ENCOUNTER — Encounter: Payer: Self-pay | Admitting: Adult Health

## 2012-03-14 VITALS — BP 110/70 | HR 49 | Wt 241.0 lb

## 2012-03-14 DIAGNOSIS — I509 Heart failure, unspecified: Secondary | ICD-10-CM

## 2012-03-14 DIAGNOSIS — Z79899 Other long term (current) drug therapy: Secondary | ICD-10-CM | POA: Insufficient documentation

## 2012-03-14 DIAGNOSIS — I428 Other cardiomyopathies: Secondary | ICD-10-CM

## 2012-03-14 DIAGNOSIS — I4891 Unspecified atrial fibrillation: Secondary | ICD-10-CM

## 2012-03-14 DIAGNOSIS — I10 Essential (primary) hypertension: Secondary | ICD-10-CM

## 2012-03-14 DIAGNOSIS — R0602 Shortness of breath: Secondary | ICD-10-CM

## 2012-03-14 DIAGNOSIS — Z951 Presence of aortocoronary bypass graft: Secondary | ICD-10-CM | POA: Insufficient documentation

## 2012-03-14 DIAGNOSIS — I2510 Atherosclerotic heart disease of native coronary artery without angina pectoris: Secondary | ICD-10-CM

## 2012-03-14 MED ORDER — FUROSEMIDE 40 MG PO TABS: 40.0000 mg | ORAL_TABLET | Freq: Two times a day (BID) | ORAL | Status: DC

## 2012-03-14 MED ORDER — CARVEDILOL 12.5 MG PO TABS: 12.5000 mg | ORAL_TABLET | Freq: Two times a day (BID) | ORAL | Status: DC

## 2012-03-14 NOTE — Progress Notes (Deleted)
Name: Phillip Henderson    DOB: 03-12-1934  Age: 78 y.o.  MR#: [id number]       PCP:  Kirk Ruths, MD      Insurance: @PAYORNAME @   CC:   No chief complaint on file.  METOLAZONE STARTED ON Tuesday AT 5 MG PER DAY PER PCP.  HAS LOST 7 LB SINCE THEN.  LASIX WAS DISCONTINUED AND REMAINS ON POTASSIUM.  LABS PERFORMED AND ARE ATTACHED  VS BP 110/70  Pulse 49  Wt 241 lb (109.317 kg)  SpO2 92%  Weights Current Weight  03/14/12 241 lb (109.317 kg)  10/24/11 215 lb (97.523 kg)  04/12/11 215 lb (97.523 kg)    Blood Pressure  BP Readings from Last 3 Encounters:  03/14/12 110/70  10/24/11 140/70  04/12/11 143/74     Admit date:  (Not on file) Last encounter with RMR:  10/24/2011   Allergy Allergies  Allergen Reactions  . Promethazine Hcl     Current Outpatient Prescriptions  Medication Sig Dispense Refill  . betamethasone dipropionate (DIPROLENE) 0.05 % cream Apply 1 application topically 2 (two) times daily as needed.       . clopidogrel (PLAVIX) 75 MG tablet Take 1 tablet (75 mg total) by mouth daily. Stop pradaxa  90 tablet  3  . diltiazem (CARDIZEM) 30 MG tablet TAKE 1 TABLET (30 MG TOTAL) BY MOUTH 2 (TWO) TIMES DAILY.  30 tablet  4  . insulin glargine (LANTUS) 100 UNIT/ML injection Inject 60-64 Units into the skin at bedtime.       . meloxicam (MOBIC) 15 MG tablet Take 15 mg by mouth daily.       . metolazone (ZAROXOLYN) 5 MG tablet Take 5 mg by mouth daily.      . metoprolol (LOPRESSOR) 100 MG tablet Take 50 mg by mouth 2 (two) times daily.        Marland Kitchen PACERONE 200 MG tablet TAKE ONE TABLET BY MOUTH TWICE DAILY  60 each  12  . potassium chloride (KLOR-CON) 10 MEQ CR tablet Take 15 mEq by mouth daily.        . pravastatin (PRAVACHOL) 20 MG tablet Take 40 mg by mouth at bedtime.       . ramipril (ALTACE) 10 MG capsule Take 20 mg by mouth daily.       Marland Kitchen senna (SENOKOT) 8.6 MG tablet Take 1 tablet by mouth 2 (two) times daily as needed.          Discontinued Meds:     Medications Discontinued During This Encounter  Medication Reason  . furosemide (LASIX) 40 MG tablet Discontinued by provider    Patient Active Problem List  Diagnosis  . DIABETES MELLITUS  . HYPERCHOLESTEROLEMIA  . HYPERTENSION  . CAD  . CARDIOMYOPATHY  . CHF  . EDEMA  . DYSPNEA  . A-fib  . Anticoagulation management encounter    LABS No visits with results within 3 Month(s) from this visit. Latest known visit with results is:  Orders Only on 12/21/2010  Component Date Value  . Sodium 12/21/2010 139   . Potassium 12/21/2010 4.0   . Chloride 12/21/2010 100   . CO2 12/21/2010 28   . Glucose, Bld 12/21/2010 182*  . BUN 12/21/2010 24*  . Creat 12/21/2010 1.45*  . Calcium 12/21/2010 9.3      Results for this Opt Visit:     Results for orders placed in visit on 12/21/10  BASIC METABOLIC PANEL      Component Value Range  Sodium 139  135 - 145 mEq/L   Potassium 4.0  3.5 - 5.3 mEq/L   Chloride 100  96 - 112 mEq/L   CO2 28  19 - 32 mEq/L   Glucose, Bld 182 (*) 70 - 99 mg/dL   BUN 24 (*) 6 - 23 mg/dL   Creat 4.54 (*) 0.98 - 1.35 mg/dL   Calcium 9.3  8.4 - 11.9 mg/dL    EKG Orders placed in visit on 03/14/12  . EKG 12-LEAD     Prior Assessment and Plan Problem List as of 03/14/2012            Cardiology Problems   HYPERCHOLESTEROLEMIA   Last Assessment & Plan Note   04/12/2011 Office Visit Signed 04/12/2011 11:00 AM by Wendall Stade, MD     Cholesterol is at goal.  Continue current dose of statin and diet Rx.  No myalgias or side effects.  F/U  LFT's in 6 months. Lab Results  Component Value Date   LDLCALC  Value: 104        Total Cholesterol/HDL:CHD Risk Coronary Heart Disease Risk Table                     Men   Women  1/2 Average Risk   3.4   3.3  Average Risk       5.0   4.4  2 X Average Risk   9.6   7.1  3 X Average Risk  23.4   11.0        Use the calculated Patient Ratio above and the CHD Risk Table to determine the patient's CHD Risk.        ATP III  CLASSIFICATION (LDL):  <100     mg/dL   Optimal  147-829  mg/dL   Near or Above                    Optimal  130-159  mg/dL   Borderline  562-130  mg/dL   High  >865     mg/dL   Very High* 08/14/4694                HYPERTENSION   Last Assessment & Plan Note   04/12/2011 Office Visit Signed 04/12/2011 11:00 AM by Wendall Stade, MD    Well controlled.  Continue current medications and low sodium Dash type diet.       CAD   Last Assessment & Plan Note   11/08/2010 Office Visit Signed 11/08/2010  9:52 AM by Wendall Stade, MD    Stable with no angina and good activity level.  Continue medical Rx     CARDIOMYOPATHY   Last Assessment & Plan Note   10/24/2011 Office Visit Signed 10/24/2011 11:43 AM by Jodelle Gross, NP    The patient is asymptomatic. He denies any presyncope palpitations or significant fatigue. His weight is unchanged since being seen in March of 2013. He is medically compliant. Labs are being drawn by his primary care physician in July of 2013. Labs are all within normal limits with a mildly elevated creatinine of 1.62. LFTs are normal limits. He has not found to be anemic hemoglobin is 14.2 with hematocrit of 41.9. Cholesterol status is also evaluated with a total cholesterol 138 triglycerides of 62 LDL of 97.    CHF   Last Assessment & Plan Note   04/12/2011 Office Visit Signed 04/12/2011 11:01 AM by Wendall Stade, MD  Euvolemic doing much better since maint NSR    A-fib   Last Assessment & Plan Note   10/24/2011 Office Visit Signed 10/24/2011 11:45 AM by Jodelle Gross, NP    He is on a slow atrial fibrillation on Pacerone 200 mg twice a day and Cardizem 30 mg twice a day along with Lopressor 50 mg twice a day. He is completely asymptomatic with low heart rate. He is requesting a change in Pradaxa and return to Plavix as his insurance company is not paying his prescription coverage for the former. I have changed him back to clopidogrel 75 mg daily with a 90 day supply. He  will followup with Dr. Winn Jock in 6 months unless he becomes symptomatic. May need to consider decreasing beta blocker or Pacerone dosage at that time if his heart rate becomes more bradycardic, or he is symptomatic. At this time we will make no changes.      Other   DIABETES MELLITUS   Last Assessment & Plan Note   04/12/2011 Office Visit Signed 04/12/2011 10:59 AM by Wendall Stade, MD    Discussed low carb diet.  Target hemoglobin A1c is 6.5 or less.  Continue current medications.     EDEMA   DYSPNEA   Anticoagulation management encounter   Last Assessment & Plan Note   04/12/2011 Office Visit Signed 04/12/2011 11:01 AM by Wendall Stade, MD    Check CBC and BMET  No bleeding problems          Imaging: No results found.   FRS Calculation: Score not calculated. Missing: Total Cholesterol

## 2012-03-14 NOTE — Assessment & Plan Note (Signed)
He has decompensated systolic CHF. He has diuresed 7lbs in two days, but is 25 lbs heavier than dry wt. I have discussed this patient with Dr. Dietrich Pates on site.Medication changes will be made as follows:  Place back on Lasix 40 mg TID D/C cardiazem and metoprolol Start Coreg 12.5 mg BID Continue metolazone 5 mg for 3 days and then decrease to 2.5 mg daily. Pacerone should be 200 mg daily.  BMET, TSH, CXR, and BNP will be done today. Follow up BMET in 5 days with follow up office visit in one week. He is to weigh daily with a digital scale.  I have again spoken to him about ICD pacemaker. He does not wish to consider it, preferring to be treated medically only.

## 2012-03-14 NOTE — Patient Instructions (Addendum)
Your physician recommends that you schedule a follow-up appointment in: 1 week  Your physician recommends that you return for lab work in: Today and Monday  Your physician has recommended you make the following change in your medication:  1 - STOP Metoprolol 2 - STOP Cardizem  3 - Restart Lasix at 40 mg twice a day 4 - START Coreg (carvedilol) 12.5 mg twice a day  A chest x-ray takes a picture of the organs and structures inside the chest, including the heart, lungs, and blood vessels. This test can show several things, including, whether the heart is enlarges; whether fluid is building up in the lungs; and whether pacemaker / defibrillator leads are still in place.  Monitor weights

## 2012-03-14 NOTE — Assessment & Plan Note (Signed)
Denies complaints that this time for chest pain. Continue to monitor. EKG shows junctional rhythm. With medication changes to coreg from both diltiazem and metoprolol, but continued pacerone, will repeat EKG on follow up visit.

## 2012-03-14 NOTE — Progress Notes (Signed)
HPI: [name] is a 78 y/o patiento of Dr. Eden Emms we are following for ongoing assessment and treatment of ischemic CM, EF of 25%, CABG, atrial fibrillation, and hypercholesterolemia. He has been recommended for ICD pacemaker on numerous occasions but has refused implantation, He has also been recommended for repeat cardiac catheterization but has refused further invasive testing. He wishes medical management only. He remains on Plavix but refused coumadin.    He has been seen by Dr. Regino Schultze for increased fluid retention with 30 lbs wt gain 2 days ago, and has been placed on metolazone 5 mg daily. He stopped taking the lasix inadvertently but has lost 7lbs in two days. He continues to have abdominal distention and LEE to just above the knees. He is unable to lie flat due to PND. He denies chest pain or near syncope.   Allergies  Allergen Reactions  . Promethazine Hcl     Current Outpatient Prescriptions  Medication Sig Dispense Refill  . amiodarone (PACERONE) 200 MG tablet Take 200 mg by mouth daily.      . betamethasone dipropionate (DIPROLENE) 0.05 % cream Apply 1 application topically 2 (two) times daily as needed.       . clopidogrel (PLAVIX) 75 MG tablet Take 1 tablet (75 mg total) by mouth daily. Stop pradaxa  90 tablet  3  . insulin glargine (LANTUS) 100 UNIT/ML injection Inject 60-64 Units into the skin at bedtime.       . meloxicam (MOBIC) 15 MG tablet Take 15 mg by mouth daily.       . metolazone (ZAROXOLYN) 5 MG tablet Take 5 mg by mouth daily.      . potassium chloride (KLOR-CON) 10 MEQ CR tablet Take 15 mEq by mouth daily.        . pravastatin (PRAVACHOL) 20 MG tablet Take 40 mg by mouth at bedtime.       . ramipril (ALTACE) 10 MG capsule Take 20 mg by mouth daily.       Marland Kitchen senna (SENOKOT) 8.6 MG tablet Take 1 tablet by mouth 2 (two) times daily as needed.        . carvedilol (COREG) 12.5 MG tablet Take 1 tablet (12.5 mg total) by mouth 2 (two) times daily.  60 tablet  6  .  furosemide (LASIX) 40 MG tablet Take 1 tablet (40 mg total) by mouth 2 (two) times daily.  60 tablet  6    Past Medical History  Diagnosis Date  . CAD (coronary artery disease)     ant MI 36. CABG 2001 [place] D1 SVG LAD SVG om SVG PDA EF 30%  . CHF (congestive heart failure)   . Diabetes mellitus     mellitus? lantus at night  . Hyperlipidemia     Past Surgical History  Procedure Date  . Coronary artery bypass graft   . Cardiac catheterization     BJY:NWGNFA of systems complete and found to be negative unless listed above  PHYSICAL EXAM BP 110/70  Pulse 49  Wt 241 lb (109.317 kg)  SpO2 92%  General: Well developed, well nourished, in no acute distress, pale Head: Eyes PERRLA, No xanthomas.   Normal cephalic and atramatic  Lungs: Bilaterally diminished to the middle lobes.  Heart: HRRR S1 S2, distant heart sounds, with promient S3, bradycardic  Pulses are 2+ & equal.            No carotid bruit. No JVD.  No abdominal bruits. No femoral bruits. Abdomen: Bowel  sounds are positive, abdomen distended and non-tender without masses or                  Hernia's noted. Msk:  Back normal, normal gait. Normal strength and tone for age. Extremities: No clubbing, cyanosis 2+ pretibial edema up to the knees.  DP diminished. Neuro: Alert and oriented X 3. Psych:  Good affect, responds appropriately   EKG: Junctional rhythm rate of 50 bpm, with inferior Q-waves noted.  ASSESSMENT AND PLAN

## 2012-03-14 NOTE — Assessment & Plan Note (Signed)
Blood pressure is elevated for patient with systolic dysfunction and EF of 25%.  Will not make any further medication changes at this time while we are in the process of diuresing this patient. He is advised to go to ER if symptoms worsen or edema worsens.

## 2012-03-14 NOTE — Telephone Encounter (Signed)
Clarified dose to be 40 mg bid

## 2012-03-14 NOTE — Telephone Encounter (Signed)
PT WAS TOLD TO START BACK TAKING 40MG  LASIX, HE USE TO TAKE 1 1/2 PILLS A DAY MAKING IT 60MG . THEY WANT TO KNOW IF HE IS ONLY TAKING 40MG  OR 60MG 

## 2012-03-15 ENCOUNTER — Telehealth: Payer: Self-pay | Admitting: Nurse Practitioner

## 2012-03-15 LAB — BASIC METABOLIC PANEL
BUN: 18 mg/dL (ref 6–23)
Chloride: 95 mEq/L — ABNORMAL LOW (ref 96–112)
Creat: 1.72 mg/dL — ABNORMAL HIGH (ref 0.50–1.35)

## 2012-03-15 LAB — BRAIN NATRIURETIC PEPTIDE: Brain Natriuretic Peptide: 525.6 pg/mL — ABNORMAL HIGH (ref 0.0–100.0)

## 2012-03-15 LAB — TSH: TSH: 1.531 u[IU]/mL (ref 0.350–4.500)

## 2012-03-15 NOTE — Telephone Encounter (Signed)
PT STATES THAT WE HAVE 2 MEDICATIONS IN OUR LIST OF MEDS WE GAVE HIM THAT HE IS NOT ON

## 2012-03-15 NOTE — Telephone Encounter (Signed)
New scale wt is fine. Continue to weigh daily. Different scales reflect different weights. Keep log of HIS scale's weights and bring with him to next appointment in a few days. Call for problems of chest pain, syncope, or DOE.

## 2012-03-15 NOTE — Telephone Encounter (Signed)
Called pt to clarify which medications he is no longer taking, pt daughter noted he is no longer taking meloxicam nor the sennokot, removed both from pt list, she was advised at yesterdays OV with [facility] to purchase the pt a digital scale, pt weighed 241 in our office yesterday and today weighed 229 on the new scale and she wanted to make sure the weightloss in that short period of time is ok for the pt, please advise

## 2012-03-17 ENCOUNTER — Telehealth: Payer: Self-pay | Admitting: Physician Assistant

## 2012-03-17 NOTE — Telephone Encounter (Signed)
Patient called the answering service re: nausea, vomiting, diarrhea and concern of dehydration. Patient was recently seen in the [place] office 2/6. He was noted be fluid overloaded (~ 30 lbs) earlier this week, and was started on metolazone daily by Dr. Regino Schultze. He had stopped taking Lasix, and this was resumed on 2/6 follow-up. Weight 241 lbs at that time. Per pt's report, weight was 229 lbs yesterday, 218 lbs today. Baseline weight ~ 216 lbs. He has recently been started on Keflex for lower extremity ulcers, and has had episodes of diarrhea and emesis. He denies chest pain otherwise, and breathing has unchanged. He has been taking all diuretics as prescribed and is urinating quite frequently. Advised to hold diuretics and antibiotic today, and to conservatively hydrate with sips of water. He has lab work scheduled for tomorrow at which time renal function can be assessed, and further recommendations for diuresis can be made (will likely need to be down-titrated and/or metolazone stopped altogether). He also has another follow-up appointment with Joni Reining at that time. Advised if he continues to vomit or have diarrhea, or if this becomes profuse, to present to an ED for formal evaluation, lab work and likely IVF hydration. He will call his PCP tomorrow for further recommendations regarding antibiotic use. He understood and agreed.   Jacqulyn Bath, PA-C 03/17/2012 2:34 PM

## 2012-03-18 ENCOUNTER — Telehealth: Payer: Self-pay | Admitting: Adult Health

## 2012-03-18 ENCOUNTER — Other Ambulatory Visit: Payer: Self-pay | Admitting: Adult Health

## 2012-03-18 NOTE — Telephone Encounter (Signed)
Left message on pt home phone to advise his current sxs would need to be evaluated by PCP, pt spouse called back while documenting in chart and was advised PCP would need to evaluate however if pt feels dehydration is prevalent there is a "Possibility" for need of IV fluids in the ED however the PCP would still be his best step at the present time, pt spouse understood and will have pt call PCP

## 2012-03-18 NOTE — Telephone Encounter (Signed)
Pt has been throwing up over the weekend and has not been able to take his medication. Dehydrated and constipated and this morning his muscles in his legs are sore, he is staying cold and sleeping more.   Pt has been able to eat some soup and has been drinking very little/tmj

## 2012-03-18 NOTE — Telephone Encounter (Signed)
Spoke with daughter, who was advised to contact Dr [facility] office, as he walked in the office with same complaints that he had over the weekend.   They were advised to make an appointment with Dr Cherre Huger, as they were originally instructed.  When they were called to confirm that he had stopped the metolazone, daughter made me aware that he had done so, however would not go to the Dr and was at [employer].  Stressed the importance of him taking care of himself.  States he was "just going to go home".

## 2012-03-18 NOTE — Telephone Encounter (Signed)
Pt was advised to take Metolazone for 3 days only. Please call and make sure he is not taking it any more.

## 2012-03-19 LAB — BASIC METABOLIC PANEL
CO2: 35 mEq/L — ABNORMAL HIGH (ref 19–32)
Calcium: 9.7 mg/dL (ref 8.4–10.5)
Creat: 1.51 mg/dL — ABNORMAL HIGH (ref 0.50–1.35)

## 2012-03-19 LAB — BRAIN NATRIURETIC PEPTIDE: Brain Natriuretic Peptide: 306.4 pg/mL — ABNORMAL HIGH (ref 0.0–100.0)

## 2012-03-21 ENCOUNTER — Ambulatory Visit: Payer: PRIVATE HEALTH INSURANCE | Admitting: Adult Health

## 2012-03-22 ENCOUNTER — Ambulatory Visit: Payer: PRIVATE HEALTH INSURANCE | Admitting: Adult Health

## 2012-03-23 ENCOUNTER — Other Ambulatory Visit: Payer: Self-pay

## 2012-04-01 ENCOUNTER — Ambulatory Visit (INDEPENDENT_AMBULATORY_CARE_PROVIDER_SITE_OTHER): Payer: Medicare Other | Admitting: Adult Health

## 2012-04-01 ENCOUNTER — Encounter: Payer: Self-pay | Admitting: Adult Health

## 2012-04-01 VITALS — BP 122/68 | HR 54 | Ht 72.0 in | Wt 224.0 lb

## 2012-04-01 DIAGNOSIS — R609 Edema, unspecified: Secondary | ICD-10-CM

## 2012-04-01 DIAGNOSIS — I4891 Unspecified atrial fibrillation: Secondary | ICD-10-CM

## 2012-04-01 DIAGNOSIS — I25810 Atherosclerosis of coronary artery bypass graft(s) without angina pectoris: Secondary | ICD-10-CM

## 2012-04-01 DIAGNOSIS — I509 Heart failure, unspecified: Secondary | ICD-10-CM

## 2012-04-01 DIAGNOSIS — R5381 Other malaise: Secondary | ICD-10-CM

## 2012-04-01 DIAGNOSIS — R5383 Other fatigue: Secondary | ICD-10-CM

## 2012-04-01 DIAGNOSIS — I10 Essential (primary) hypertension: Secondary | ICD-10-CM

## 2012-04-01 NOTE — Assessment & Plan Note (Signed)
Resolved

## 2012-04-01 NOTE — Progress Notes (Signed)
HPI: [name] is a 78 y/o patient of Dr. Eden Emms we are following for ongoing assessment and treatment of ischemic CM with EF of 25%, hypercholesterolemia, and systolic CHF. He was last seen one week ago with fluid retention, decompensation of CHF. He had gained a total of 7 lbs since last office visit, and a total of 25 lbs from dry wt. He refuses further cardiac testing, or ICD pacemaker. On last visit we made medication changes to include stopping cardizem, and metoprolol, started coreg 12.5 mg BID, and had lasix increased to 40 mg BID. He has 3 days of metolazone.  He has seen his PCP Dr, Regino Schultze and metolazone was stopped after two days due to nausea. He has lost 13 lbs since last visit, but now has complaints of constipation and fullness in his abdomen with burping and gas. Breathing is much better.  Allergies  Allergen Reactions  . Cephalexin   . Promethazine Hcl     Current Outpatient Prescriptions  Medication Sig Dispense Refill  . amiodarone (PACERONE) 200 MG tablet Take 200 mg by mouth daily.      . betamethasone dipropionate (DIPROLENE) 0.05 % cream Apply 1 application topically 2 (two) times daily as needed.       . carvedilol (COREG) 12.5 MG tablet Take 1 tablet (12.5 mg total) by mouth 2 (two) times daily.  60 tablet  6  . clopidogrel (PLAVIX) 75 MG tablet Take 1 tablet (75 mg total) by mouth daily. Stop pradaxa  90 tablet  3  . furosemide (LASIX) 40 MG tablet Take 1 tablet (40 mg total) by mouth 2 (two) times daily.  60 tablet  6  . insulin glargine (LANTUS) 100 UNIT/ML injection Inject 60-64 Units into the skin at bedtime.       . potassium chloride (KLOR-CON) 10 MEQ CR tablet Take 15 mEq by mouth daily.        . pravastatin (PRAVACHOL) 20 MG tablet Take 40 mg by mouth at bedtime.       . ramipril (ALTACE) 10 MG capsule Take 20 mg by mouth daily.        No current facility-administered medications for this visit.    Past Medical History  Diagnosis Date  . CAD (coronary  artery disease)     ant MI 59. CABG 2001 [place] D1 SVG LAD SVG om SVG PDA EF 30%  . CHF (congestive heart failure)   . Diabetes mellitus     mellitus? lantus at night  . Hyperlipidemia     Past Surgical History  Procedure Laterality Date  . Coronary artery bypass graft    . Cardiac catheterization      ZOX:WRUEAV of systems complete and found to be negative unless listed above  PHYSICAL EXAM BP 122/68  Pulse 54  Ht 6' (1.829 m)  Wt 224 lb (101.606 kg)  BMI 30.37 kg/m2  General: Well developed, well nourished, in no acute distress, appears more fatigued. Head: Eyes PERRLA, No xanthomas.   Normal cephalic and atramatic  Lungs: Clear bilaterally to auscultation and percussion. Heart: HRIR S1 S2, distant, without MRG.  Pulses are 2+ & equal.            No carotid bruit. No JVD.  No abdominal bruits. No femoral bruits. Abdomen: Bowel sounds are positive, abdomen soft and non-tender, hypoactive bowel sounds, without masses or                  Hernia's noted. Msk:  Back normal,  normal gait. Normal strength and tone for age. Extremities: No clubbing, cyanosis or edema.  DP +1 Neuro: Alert and oriented X 3. Psych: Flat affect, responds appropriately  EKG: Atrial fibrillation with frequent PVC's. Rate of  54 bpm.  ASSESSMENT AND PLAN

## 2012-04-01 NOTE — Patient Instructions (Addendum)
Your physician recommends that you schedule a follow-up appointment in: 1 month  Your physician has recommended you make the following change in your medication:  Colace 200 mg every evening as needed Gas X as needed  Your physician recommends that you return for lab work in: Today

## 2012-04-01 NOTE — Assessment & Plan Note (Signed)
He has diuresed very well. He appears more tired, but is breathing much easier. Edema is essentially gone. He may be having some mild constipation from the diureses. I will give him lose dose Colace, stool softner, to take prn. I will recheck his BMET for kidney fx with diureses. He wants only medical management. He appears to be tolerating medication changes without complications. CBB is discontinued, which can also cause constipation.

## 2012-04-01 NOTE — Assessment & Plan Note (Signed)
Heart rate is well controlled at present. No complaints of racing or palpitations. Continue current medications.

## 2012-04-01 NOTE — Progress Notes (Deleted)
Name: Phillip Henderson    DOB: October 08, 1934  Age: 78 y.o.  MR#: [id number]       PCP:  Kirk Ruths, MD      Insurance: Payor: MEDICARE  Plan: MEDICARE PART B  Product Type: *No Product type*    CC:   No chief complaint on file.   VS Filed Vitals:   04/01/12 1336  BP: 122/68  Pulse: 54  Height: 6' (1.829 m)  Weight: 224 lb (101.606 kg)    Weights Current Weight  04/01/12 224 lb (101.606 kg)  03/14/12 241 lb (109.317 kg)  10/24/11 215 lb (97.523 kg)    Blood Pressure  BP Readings from Last 3 Encounters:  04/01/12 122/68  03/14/12 110/70  10/24/11 140/70     Admit date:  (Not on file) Last encounter with RMR:  03/22/2012   Allergy Cephalexin and Promethazine hcl  Current Outpatient Prescriptions  Medication Sig Dispense Refill  . amiodarone (PACERONE) 200 MG tablet Take 200 mg by mouth daily.      . betamethasone dipropionate (DIPROLENE) 0.05 % cream Apply 1 application topically 2 (two) times daily as needed.       . carvedilol (COREG) 12.5 MG tablet Take 1 tablet (12.5 mg total) by mouth 2 (two) times daily.  60 tablet  6  . clopidogrel (PLAVIX) 75 MG tablet Take 1 tablet (75 mg total) by mouth daily. Stop pradaxa  90 tablet  3  . furosemide (LASIX) 40 MG tablet Take 1 tablet (40 mg total) by mouth 2 (two) times daily.  60 tablet  6  . insulin glargine (LANTUS) 100 UNIT/ML injection Inject 60-64 Units into the skin at bedtime.       . potassium chloride (KLOR-CON) 10 MEQ CR tablet Take 15 mEq by mouth daily.        . pravastatin (PRAVACHOL) 20 MG tablet Take 40 mg by mouth at bedtime.       . ramipril (ALTACE) 10 MG capsule Take 20 mg by mouth daily.        No current facility-administered medications for this visit.    Discontinued Meds:    Medications Discontinued During This Encounter  Medication Reason  . metolazone (ZAROXOLYN) 5 MG tablet Error    Patient Active Problem List  Diagnosis  . DIABETES MELLITUS  . HYPERCHOLESTEROLEMIA  . HYPERTENSION  . CAD   . CARDIOMYOPATHY  . CHF  . EDEMA  . DYSPNEA  . A-fib  . Anticoagulation management encounter    LABS    Component Value Date/Time   NA 136 03/18/2012 0930   NA 139 03/14/2012 1524   NA 139 12/21/2010 0855   K 3.4* 03/18/2012 0930   K 4.3 03/14/2012 1524   K 4.0 12/21/2010 0855   CL 90* 03/18/2012 0930   CL 95* 03/14/2012 1524   CL 100 12/21/2010 0855   CO2 35* 03/18/2012 0930   CO2 35* 03/14/2012 1524   CO2 28 12/21/2010 0855   GLUCOSE 177* 03/18/2012 0930   GLUCOSE 173* 03/14/2012 1524   GLUCOSE 182* 12/21/2010 0855   BUN 19 03/18/2012 0930   BUN 18 03/14/2012 1524   BUN 24* 12/21/2010 0855   CREATININE 1.51* 03/18/2012 0930   CREATININE 1.72* 03/14/2012 1524   CREATININE 1.45* 12/21/2010 0855   CREATININE 1.23 07/10/2010 0550   CREATININE 1.23 07/09/2010 0442   CREATININE 1.28 07/08/2010 1126   CALCIUM 9.7 03/18/2012 0930   CALCIUM 9.8 03/14/2012 1524   CALCIUM 9.3 12/21/2010 0855  GFRNONAA 58* 07/10/2010 0550   GFRNONAA 58* 07/09/2010 0442   GFRNONAA 55* 07/08/2010 1126   GFRAA  Value: >60        The eGFR has been calculated using the MDRD equation. This calculation has not been validated in all clinical situations. eGFR's persistently <60 mL/min signify possible Chronic Kidney Disease. 07/10/2010 0550   GFRAA  Value: >60        The eGFR has been calculated using the MDRD equation. This calculation has not been validated in all clinical situations. eGFR's persistently <60 mL/min signify possible Chronic Kidney Disease. 07/09/2010 0442   GFRAA  Value: >60        The eGFR has been calculated using the MDRD equation. This calculation has not been validated in all clinical situations. eGFR's persistently <60 mL/min signify possible Chronic Kidney Disease. 07/08/2010 1126   CMP     Component Value Date/Time   NA 136 03/18/2012 0930   K 3.4* 03/18/2012 0930   CL 90* 03/18/2012 0930   CO2 35* 03/18/2012 0930   GLUCOSE 177* 03/18/2012 0930   BUN 19 03/18/2012 0930   CREATININE 1.51* 03/18/2012 0930   CREATININE  1.23 07/10/2010 0550   CALCIUM 9.7 03/18/2012 0930   PROT 6.6 07/09/2010 0442   ALBUMIN 3.5 07/09/2010 0442   AST 17 07/09/2010 0442   ALT 16 07/09/2010 0442   ALKPHOS 96 07/09/2010 0442   BILITOT 1.1 07/09/2010 0442   GFRNONAA 58* 07/10/2010 0550   GFRAA  Value: >60        The eGFR has been calculated using the MDRD equation. This calculation has not been validated in all clinical situations. eGFR's persistently <60 mL/min signify possible Chronic Kidney Disease. 07/10/2010 0550       Component Value Date/Time   WBC 9.3 07/09/2010 0442   WBC 10.6* 07/08/2010 1126   HGB 14.0 07/09/2010 0442   HGB 15.4 07/08/2010 1126   HCT 44.3 07/09/2010 0442   HCT 47.7 07/08/2010 1126   MCV 88.1 07/09/2010 0442   MCV 87.7 07/08/2010 1126    Lipid Panel     Component Value Date/Time   CHOL 144 07/09/2010 0442   TRIG 90 07/09/2010 0442   HDL 22* 07/09/2010 0442   CHOLHDL 6.5 07/09/2010 0442   VLDL 18 07/09/2010 0442   LDLCALC  Value: 104        Total Cholesterol/HDL:CHD Risk Coronary Heart Disease Risk Table                     Men   Women  1/2 Average Risk   3.4   3.3  Average Risk       5.0   4.4  2 X Average Risk   9.6   7.1  3 X Average Risk  23.4   11.0        Use the calculated Patient Ratio above and the CHD Risk Table to determine the patient's CHD Risk.        ATP III CLASSIFICATION (LDL):  <100     mg/dL   Optimal  161-096  mg/dL   Near or Above                    Optimal  130-159  mg/dL   Borderline  045-409  mg/dL   High  >811     mg/dL   Very High* 10/07/4780 9562    ABG No results found for this basename: phart, pco2, pco2art, po2, po2art, hco3,  tco2, acidbasedef, o2sat     Lab Results  Component Value Date   TSH 1.749 03/18/2012   BNP (last 3 results) No results found for this basename: PROBNP,  in the last 8760 hours Cardiac Panel (last 3 results) No results found for this basename: CKTOTAL, CKMB, TROPONINI, RELINDX,  in the last 72 hours  Iron/TIBC/Ferritin No results found for this basename: iron, tibc, ferritin      EKG Orders placed in visit on 04/01/12  . EKG 12-LEAD     Prior Assessment and Plan Problem List as of 04/01/2012     ICD-9-CM     Cardiology Problems   HYPERCHOLESTEROLEMIA   Last Assessment & Plan   04/12/2011 Office Visit Written 04/12/2011 11:00 AM by Wendall Stade, MD      Cholesterol is at goal.  Continue current dose of statin and diet Rx.  No myalgias or side effects.  F/U  LFT's in 6 months. Lab Results  Component Value Date   LDLCALC  Value: 104        Total Cholesterol/HDL:CHD Risk Coronary Heart Disease Risk Table                     Men   Women  1/2 Average Risk   3.4   3.3  Average Risk       5.0   4.4  2 X Average Risk   9.6   7.1  3 X Average Risk  23.4   11.0        Use the calculated Patient Ratio above and the CHD Risk Table to determine the patient's CHD Risk.        ATP III CLASSIFICATION (LDL):  <100     mg/dL   Optimal  161-096  mg/dL   Near or Above                    Optimal  130-159  mg/dL   Borderline  045-409  mg/dL   High  >811     mg/dL   Very High* 10/07/4780                HYPERTENSION   Last Assessment & Plan   03/14/2012 Office Visit Written 03/14/2012  5:20 PM by Jodelle Gross, NP     Blood pressure is elevated for patient with systolic dysfunction and EF of 25%.  Will not make any further medication changes at this time while we are in the process of diuresing this patient. He is advised to go to ER if symptoms worsen or edema worsens.     CAD   Last Assessment & Plan   03/14/2012 Office Visit Written 03/14/2012  5:21 PM by Jodelle Gross, NP     Denies complaints that this time for chest pain. Continue to monitor. EKG shows junctional rhythm. With medication changes to coreg from both diltiazem and metoprolol, but continued pacerone, will repeat EKG on follow up visit.    CARDIOMYOPATHY   Last Assessment & Plan   03/14/2012 Office Visit Written 03/14/2012  5:19 PM by Jodelle Gross, NP     He has decompensated systolic CHF. He has diuresed  7lbs in two days, but is 25 lbs heavier than dry wt. I have discussed this patient with Dr. Dietrich Pates on site.Medication changes will be made as follows:  Place back on Lasix 40 mg TID D/C cardiazem and metoprolol Start Coreg 12.5 mg BID Continue metolazone 5 mg for 3  days and then decrease to 2.5 mg daily. Pacerone should be 200 mg daily.  BMET, TSH, CXR, and BNP will be done today. Follow up BMET in 5 days with follow up office visit in one week. He is to weigh daily with a digital scale.  I have again spoken to him about ICD pacemaker. He does not wish to consider it, preferring to be treated medically only.     CHF   Last Assessment & Plan   04/12/2011 Office Visit Written 04/12/2011 11:01 AM by Wendall Stade, MD     Euvolemic doing much better since maint NSR    A-fib   Last Assessment & Plan   10/24/2011 Office Visit Written 10/24/2011 11:45 AM by Jodelle Gross, NP     He is on a slow atrial fibrillation on Pacerone 200 mg twice a day and Cardizem 30 mg twice a day along with Lopressor 50 mg twice a day. He is completely asymptomatic with low heart rate. He is requesting a change in Pradaxa and return to Plavix as his insurance company is not paying his prescription coverage for the former. I have changed him back to clopidogrel 75 mg daily with a 90 day supply. He will followup with Dr. Winn Jock in 6 months unless he becomes symptomatic. May need to consider decreasing beta blocker or Pacerone dosage at that time if his heart rate becomes more bradycardic, or he is symptomatic. At this time we will make no changes.      Other   DIABETES MELLITUS   Last Assessment & Plan   04/12/2011 Office Visit Written 04/12/2011 10:59 AM by Wendall Stade, MD     Discussed low carb diet.  Target hemoglobin A1c is 6.5 or less.  Continue current medications.     EDEMA   DYSPNEA   Anticoagulation management encounter   Last Assessment & Plan   04/12/2011 Office Visit Written 04/12/2011 11:01 AM by  Wendall Stade, MD     Check CBC and BMET  No bleeding problems          Imaging: Dg Chest 2 View  03/14/2012  *RADIOLOGY REPORT*  Clinical Data: Fluid retention, atrial fibrillation, CHF, shortness of breath, amiodarone therapy, coronary disease, diabetes, hyperlipidemia, cardiomyopathy  CHEST - 2 VIEW  Comparison: 07/08/2010  Findings: Enlargement of cardiac silhouette post CABG. Calcified tortuous aorta. Pulmonary vascular congestion. Scattered peribronchial thickening with mild streaky atelectasis at left lower lobe. No gross pulmonary edema or segmental consolidation. No pleural effusion or pneumothorax. Bones diffusely demineralized.  IMPRESSION: Enlargement of cardiac silhouette with pulmonary vascular congestion. Bronchitic changes with left basilar atelectasis.   Original Report Authenticated By: Ulyses Southward, M.D.

## 2012-04-01 NOTE — Assessment & Plan Note (Signed)
BP is ok, but higher than I like it for EF of 25%. Will increase carvedilol on next visit, as he has only been on the new medication for one week. Continue Altace. May need to add spironolactone once kidney fx is evaluated.

## 2012-04-02 LAB — BASIC METABOLIC PANEL
Calcium: 9 mg/dL (ref 8.4–10.5)
Glucose, Bld: 279 mg/dL — ABNORMAL HIGH (ref 70–99)
Sodium: 136 mEq/L (ref 135–145)

## 2012-04-02 LAB — TSH: TSH: 0.933 u[IU]/mL (ref 0.350–4.500)

## 2012-04-05 ENCOUNTER — Telehealth: Payer: Self-pay | Admitting: Cardiology

## 2012-04-05 NOTE — Telephone Encounter (Signed)
Would like to speak with nurse regarding side affects of new medicine. / tgs

## 2012-04-06 ENCOUNTER — Telehealth: Payer: Self-pay | Admitting: Physician Assistant

## 2012-04-06 ENCOUNTER — Encounter: Payer: Self-pay | Admitting: Adult Health

## 2012-04-06 NOTE — Telephone Encounter (Signed)
Pt's daughter called the answering service re: pt c/o vision disturbance and eyes crossing. She reports that over the last few days he has developed dry eyes, blurry then double vision and eyes crossing. He has attributed this to Coreg which was started ~ 3 weeks ago, and is refusing to take it. He denies weakness, numbness, tingling, slurred speech or facial droop. The SE profile for Coreg does include vision disturbance and photophobia. Advised to discontinue, and monitor BP and symptoms. May need to be started on an alternative BB (metoprolol tartrate vs bisoprolol for mortality benefit?), but will defer to Joni Reining regarding this decision. Will also forward note for further recs.    Jacqulyn Bath, PA-C 04/06/2012 7:09 PM

## 2012-04-08 NOTE — Telephone Encounter (Signed)
Please advise 

## 2012-04-30 ENCOUNTER — Encounter: Payer: Self-pay | Admitting: Cardiovascular Disease

## 2012-04-30 ENCOUNTER — Ambulatory Visit (INDEPENDENT_AMBULATORY_CARE_PROVIDER_SITE_OTHER): Payer: Medicare Other | Admitting: Cardiovascular Disease

## 2012-04-30 ENCOUNTER — Ambulatory Visit: Payer: Medicare Other | Admitting: Adult Health

## 2012-04-30 VITALS — BP 123/63 | HR 57 | Ht 72.0 in | Wt 229.5 lb

## 2012-04-30 DIAGNOSIS — E7800 Pure hypercholesterolemia, unspecified: Secondary | ICD-10-CM

## 2012-04-30 DIAGNOSIS — I10 Essential (primary) hypertension: Secondary | ICD-10-CM

## 2012-04-30 DIAGNOSIS — R0602 Shortness of breath: Secondary | ICD-10-CM

## 2012-04-30 DIAGNOSIS — I4891 Unspecified atrial fibrillation: Secondary | ICD-10-CM

## 2012-04-30 DIAGNOSIS — I509 Heart failure, unspecified: Secondary | ICD-10-CM

## 2012-04-30 LAB — BASIC METABOLIC PANEL
BUN: 19 mg/dL (ref 6–23)
Calcium: 9.1 mg/dL (ref 8.4–10.5)
Creat: 1.41 mg/dL — ABNORMAL HIGH (ref 0.50–1.35)
Glucose, Bld: 233 mg/dL — ABNORMAL HIGH (ref 70–99)
Potassium: 4 mEq/L (ref 3.5–5.3)

## 2012-04-30 NOTE — Patient Instructions (Addendum)
Your physician recommends that you schedule a follow-up appointment in: 1 month  Your physician recommends that you return for lab work in: today

## 2012-04-30 NOTE — Assessment & Plan Note (Signed)
Cholesterol is at goal.  Continue current dose of statin and diet Rx.  No myalgias or side effects.  F/U  LFT's in 6 months. Lab Results  Component Value Date   LDLCALC  Value: 104        Total Cholesterol/HDL:CHD Risk Coronary Heart Disease Risk Table                     Men   Women  1/2 Average Risk   3.4   3.3  Average Risk       5.0   4.4  2 X Average Risk   9.6   7.1  3 X Average Risk  23.4   11.0        Use the calculated Patient Ratio above and the CHD Risk Table to determine the patient's CHD Risk.        ATP III CLASSIFICATION (LDL):  <100     mg/dL   Optimal  100-129  mg/dL   Near or Above                    Optimal  130-159  mg/dL   Borderline  160-189  mg/dL   High  >190     mg/dL   Very High* 07/09/2010             

## 2012-04-30 NOTE — Assessment & Plan Note (Signed)
Well controlled.  Continue current medications and low sodium Dash type diet.    

## 2012-04-30 NOTE — Assessment & Plan Note (Signed)
Maint NSR continue low dose amiodarone 

## 2012-04-30 NOTE — Assessment & Plan Note (Addendum)
Encouraged to take lasix 3rd dose / day if needed. No metazalone due to nausea Can use demedex or bumex in future if needed. Confirmed fact that he does not want BiV AICD or aggressive w/u Rx  Check BMET / BNP

## 2012-04-30 NOTE — Progress Notes (Signed)
Patient ID: Phillip Henderson, male   DOB: [birth year], 78 y.o.   MRN: [id number] [name] is a 78 y/o patient   we are following for ongoing assessment and treatment of ischemic CM with EF of 25%, hypercholesterolemia, and systolic CHF. He was last seen one week ago with fluid retention, decompensation of CHF. He had gained a total of 7 lbs since last office visit, and a total of 25 lbs from dry wt. He refuses further cardiac testing, or ICD pacemaker. On last visit we made medication changes to include stopping cardizem, and metoprolol, started coreg 12.5 mg BID, and had lasix increased to 40 mg BID. He has 3 days of metolazone. He has seen his PCP Dr, Regino Schultze and metolazone was stopped after two days due to nausea. He has lost 13 lbs since last visit, but now has complaints of constipation and fullness in his abdomen with burping and gas. Breathing is much better.  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   Hosptalized in May 2012  at [facility] for CHF Found to be in afib. Subsequently anticoagulated and had TEE/DCC on July 3rd. Has been on amiodarone 400 bid to help maintain NSR  A month ago BNP was in 300 range   ROS: Denies fever, malais, weight loss, blurry vision, decreased visual acuity, cough, sputum, SOB, hemoptysis, pleuritic pain, palpitaitons, heartburn, abdominal pain, melena, lower extremity edema, claudication, or rash.  All other systems reviewed and negative  General: Affect appropriate Healthy:  appears stated age HEENT: normal Neck supple with no adenopathy JVP normal no bruits no thyromegaly Lungs clear with no wheezing and good  diaphragmatic motion Heart:  S1/S2 no murmur, no rub, gallop or click PMI normal Abdomen: benighn, BS positve, no tenderness, no AAA no bruit.  No HSM or HJR Distal pulses intact with no bruits Plus 2 bilateral edema with varicose veins Neuro non-focal Skin warm and dry No muscular weakness   Current Outpatient Prescriptions  Medication Sig Dispense Refill  . amiodarone (PACERONE) 200 MG tablet Take 200 mg by mouth daily.      . betamethasone dipropionate (DIPROLENE) 0.05 % cream Apply 1 application topically 2 (two) times daily as needed.       . carvedilol (COREG) 12.5 MG tablet Take 1 tablet (12.5 mg total) by mouth 2 (two) times daily.  60 tablet  6  . clopidogrel (PLAVIX) 75 MG tablet Take 1 tablet (75 mg total) by mouth daily. Stop pradaxa  90 tablet  3  . furosemide (LASIX) 40 MG tablet Take 1 tablet (40 mg total) by mouth 2 (two) times daily.  60 tablet  6  . insulin glargine (LANTUS) 100 UNIT/ML injection Inject 60-64 Units into the skin at bedtime.       . potassium chloride (KLOR-CON) 10 MEQ CR tablet Take 15 mEq by mouth daily.        . pravastatin (PRAVACHOL) 20 MG tablet Take 40 mg by mouth at bedtime.       . ramipril (ALTACE) 10 MG capsule Take 20 mg by mouth daily.        No current facility-administered medications for this visit.  Allergies  Cephalexin and Promethazine hcl  Electrocardiogram: 04/01/12 SR PAC LBBB LAD   Assessment and Plan

## 2012-05-03 ENCOUNTER — Other Ambulatory Visit: Payer: Self-pay | Admitting: *Deleted

## 2012-05-03 ENCOUNTER — Encounter: Payer: Self-pay | Admitting: *Deleted

## 2012-05-03 MED ORDER — FUROSEMIDE 40 MG PO TABS: ORAL_TABLET | ORAL | Status: DC

## 2012-05-03 NOTE — Addendum Note (Signed)
Addended by: Reather Laurence A on: 05/03/2012 11:52 AM   Modules accepted: Orders

## 2012-05-27 ENCOUNTER — Encounter: Payer: Self-pay | Admitting: Cardiovascular Disease

## 2012-05-27 ENCOUNTER — Ambulatory Visit (INDEPENDENT_AMBULATORY_CARE_PROVIDER_SITE_OTHER): Payer: Medicare Other | Admitting: Cardiovascular Disease

## 2012-05-27 VITALS — BP 128/64 | HR 52 | Ht 72.0 in | Wt 228.0 lb

## 2012-05-27 DIAGNOSIS — I10 Essential (primary) hypertension: Secondary | ICD-10-CM

## 2012-05-27 DIAGNOSIS — I4891 Unspecified atrial fibrillation: Secondary | ICD-10-CM

## 2012-05-27 DIAGNOSIS — E7800 Pure hypercholesterolemia, unspecified: Secondary | ICD-10-CM

## 2012-05-27 DIAGNOSIS — R0602 Shortness of breath: Secondary | ICD-10-CM

## 2012-05-27 DIAGNOSIS — I509 Heart failure, unspecified: Secondary | ICD-10-CM

## 2012-05-27 DIAGNOSIS — E119 Type 2 diabetes mellitus without complications: Secondary | ICD-10-CM

## 2012-05-27 MED ORDER — METOLAZONE 5 MG PO TABS: 5.0000 mg | ORAL_TABLET | Freq: Every day | ORAL | Status: DC

## 2012-05-27 NOTE — Assessment & Plan Note (Signed)
Stable labs with primary next week does not want cath/AICD or BiV  Continue sliding scale diuretics. Weight stable Can take zaroxyln intermitantly without nausea.

## 2012-05-27 NOTE — Progress Notes (Signed)
Patient ID: Phillip Henderson, male   DOB: 22-Oct-1934, 78 y.o.   MRN: [id number] Phillip Henderson is a 78 y/o patient we are following for ongoing assessment and treatment of ischemic CM with EF of 25%, hypercholesterolemia, and systolic CHF. He was last seen one week ago with fluid retention, decompensation of CHF. He had gained a total of 7 lbs since last office visit, and a total of 25 lbs from dry wt. He refuses further cardiac testing, or ICD pacemaker. On last visit we made medication changes to include stopping cardizem, and metoprolol, started coreg 12.5 mg BID, and had lasix increased to 40 mg BID. He has 3 days of metolazone. He has seen his PCP Dr, Regino Schultze and metolazone was stopped after two days due to nausea. He has lost 13 lbs since last visit, but now has complaints of constipation and fullness in his abdomen with burping and gas. Breathing is much better.  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  Hosptalized in May 2012 at [facility] for CHF Found to be in afib. Subsequently anticoagulated and had TEE/DCC on July 3rd. Has been on amiodarone 400 bid to help maintain NSR  A month ago BNP was in 300 range  Has some eye issues related to DM  Some double vision and macular.  Weight stable Urinates frequently at night but no PND or orhtopena   ROS: Denies fever, malais, weight loss, blurry vision, decreased visual acuity, cough, sputum,  hemoptysis, pleuritic pain, palpitaitons, heartburn, abdominal pain, melena, lower extremity edema, claudication, or rash.  All other systems reviewed and negative  General: Affect appropriate Healthy:  appears stated  age HEENT: normal Neck supple with no adenopathy JVP normal no bruits no thyromegaly Lungs clear with no wheezing and good diaphragmatic motion Heart:  S1/S2 no murmur, no rub, gallop or click PMI normal Abdomen: benighn, BS positve, no tenderness, no AAA no bruit.  No HSM or HJR Distal pulses intact with no bruits Plus 2 bilateral  edema Neuro non-focal Skin warm and dry No muscular weakness   Current Outpatient Prescriptions  Medication Sig Dispense Refill  . amiodarone (PACERONE) 200 MG tablet Take 200 mg by mouth daily.      . betamethasone dipropionate (DIPROLENE) 0.05 % cream Apply 1 application topically 2 (two) times daily as needed.       . carvedilol (COREG) 12.5 MG tablet Take 1 tablet (12.5 mg total) by mouth 2 (two) times daily.  60 tablet  6  . clopidogrel (PLAVIX) 75 MG tablet Take 1 tablet (75 mg total) by mouth daily. Stop pradaxa  90 tablet  3  . furosemide (LASIX) 40 MG tablet Take 40 mg bid, alternating with tid every other day  78 tablet  6  . insulin glargine (LANTUS) 100 UNIT/ML injection Inject 60-64 Units into the skin at bedtime.       . potassium chloride (KLOR-CON) 10 MEQ CR tablet Take 15 mEq by mouth daily.        . pravastatin (PRAVACHOL) 20 MG tablet Take 40 mg by mouth at bedtime.       . ramipril (ALTACE) 10 MG capsule Take 20 mg by mouth  daily.        No current facility-administered medications for this visit.    Allergies  Cephalexin and Promethazine hcl  Electrocardiogram:  afib rate 54  IVCD    Assessment and Plan

## 2012-05-27 NOTE — Assessment & Plan Note (Signed)
Cholesterol is at goal.  Continue current dose of statin and diet Rx.  No myalgias or side effects.  F/U  LFT's in 6 months. Lab Results  Component Value Date   LDLCALC  Value: 104        Total Cholesterol/HDL:CHD Risk Coronary Heart Disease Risk Table                     Men   Women  1/2 Average Risk   3.4   3.3  Average Risk       5.0   4.4  2 X Average Risk   9.6   7.1  3 X Average Risk  23.4   11.0        Use the calculated Patient Ratio above and the CHD Risk Table to determine the patient's CHD Risk.        ATP III CLASSIFICATION (LDL):  <100     mg/dL   Optimal  100-129  mg/dL   Near or Above                    Optimal  130-159  mg/dL   Borderline  160-189  mg/dL   High  >190     mg/dL   Very High* 07/09/2010             

## 2012-05-27 NOTE — Addendum Note (Signed)
Addended by: Meda Klinefelter D on: 05/27/2012 10:29 AM   Modules accepted: Orders

## 2012-05-27 NOTE — Patient Instructions (Addendum)
Lab Work at [employer] next week ( BMET,BNP )  Your physician recommends that you schedule a follow-up appointment in: 3 months

## 2012-05-27 NOTE — Assessment & Plan Note (Signed)
Discussed low carb diet.  Target hemoglobin A1c is 6.5 or less.  Continue current medications. F/U opthamologist for visual issues

## 2012-05-27 NOTE — Assessment & Plan Note (Signed)
Well controlled.  Continue current medications and low sodium Dash type diet.    

## 2012-05-27 NOTE — Assessment & Plan Note (Signed)
Good rate control and anticoagulation Bradycardia not excessive

## 2012-06-12 ENCOUNTER — Other Ambulatory Visit: Payer: Self-pay | Admitting: Cardiovascular Disease

## 2012-06-12 LAB — BASIC METABOLIC PANEL
Calcium: 9.8 mg/dL (ref 8.4–10.5)
Glucose, Bld: 200 mg/dL — ABNORMAL HIGH (ref 70–99)
Potassium: 4.2 mEq/L (ref 3.5–5.3)
Sodium: 138 mEq/L (ref 135–145)

## 2012-06-13 LAB — BRAIN NATRIURETIC PEPTIDE: Brain Natriuretic Peptide: 95.6 pg/mL (ref 0.0–100.0)

## 2012-08-19 ENCOUNTER — Other Ambulatory Visit: Payer: Self-pay | Admitting: *Deleted

## 2012-08-19 MED ORDER — AMIODARONE HCL 200 MG PO TABS: 200.0000 mg | ORAL_TABLET | Freq: Every day | ORAL | Status: DC

## 2012-09-10 ENCOUNTER — Ambulatory Visit (INDEPENDENT_AMBULATORY_CARE_PROVIDER_SITE_OTHER): Payer: Medicare Other | Admitting: Cardiovascular Disease

## 2012-09-10 ENCOUNTER — Encounter: Payer: Self-pay | Admitting: Cardiovascular Disease

## 2012-09-10 VITALS — BP 126/74 | HR 50 | Ht 72.0 in | Wt 223.0 lb

## 2012-09-10 DIAGNOSIS — I4891 Unspecified atrial fibrillation: Secondary | ICD-10-CM

## 2012-09-10 DIAGNOSIS — I2510 Atherosclerotic heart disease of native coronary artery without angina pectoris: Secondary | ICD-10-CM

## 2012-09-10 DIAGNOSIS — I509 Heart failure, unspecified: Secondary | ICD-10-CM

## 2012-09-10 DIAGNOSIS — E7800 Pure hypercholesterolemia, unspecified: Secondary | ICD-10-CM

## 2012-09-10 DIAGNOSIS — I10 Essential (primary) hypertension: Secondary | ICD-10-CM

## 2012-09-10 NOTE — Assessment & Plan Note (Signed)
Cholesterol is at goal.  Continue current dose of statin and diet Rx.  No myalgias or side effects.  F/U  LFT's in 6 months. Lab Results  Component Value Date   LDLCALC  Value: 104        Total Cholesterol/HDL:CHD Risk Coronary Heart Disease Risk Table                     Men   Women  1/2 Average Risk   3.4   3.3  Average Risk       5.0   4.4  2 X Average Risk   9.6   7.1  3 X Average Risk  23.4   11.0        Use the calculated Patient Ratio above and the CHD Risk Table to determine the patient's CHD Risk.        ATP III CLASSIFICATION (LDL):  <100     mg/dL   Optimal  100-129  mg/dL   Near or Above                    Optimal  130-159  mg/dL   Borderline  160-189  mg/dL   High  >190     mg/dL   Very High* 07/09/2010             

## 2012-09-10 NOTE — Assessment & Plan Note (Signed)
Stable with no angina and good activity level.  Continue medical Rx  

## 2012-09-10 NOTE — Progress Notes (Signed)
Patient ID: Phillip Henderson, male   DOB: [birth year], 78 y.o.   MRN: [id number] Phillip Henderson is a 78 y/o patient we are following for ongoing assessment and treatment of ischemic CM with EF of 25%, hypercholesterolemia, and systolic CHF. He was last seen one week ago with fluid retention, decompensation of CHF. He had gained a total of 7 lbs since last office visit, and a total of 25 lbs from dry wt. He refuses further cardiac testing, or ICD pacemaker. On last visit we made medication changes to include stopping cardizem, and metoprolol, started coreg 12.5 mg BID, and had lasix increased to 40 mg BID. He has 3 days of metolazone. He has seen his PCP Dr, Regino Schultze and metolazone was stopped after two days due to nausea. He has lost 13 lbs since last visit, but now has complaints of constipation and fullness in his abdomen with burping and gas. Breathing is much better.   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   Hosptalized in May 2012 at [facility] for CHF Found to be in afib. Subsequently anticoagulated and had TEE/DCC on July 3rd. Has been on amiodarone 400 bid to help maintain NSR  A month ago BNP was in 300 range   Has some eye issues related to DM Some double vision and macular. Weight stable Urinates frequently at night but no PND or orhtopena  ROS: Denies fever, malais, weight loss, blurry vision, decreased visual acuity, cough, sputum, SOB, hemoptysis, pleuritic pain, palpitaitons, heartburn, abdominal pain, melena, lower extremity edema, claudication, or rash.  All other systems reviewed and negative  General: Affect appropriate Elderly white  male HEENT: normal Neck supple with no adenopathy JVP normal no bruits no thyromegaly Lungs clear with no wheezing and good diaphragmatic motion Heart:  S1/S2 no murmur, no rub, gallop or click PMI normal Abdomen: benighn, BS positve, no tenderness, no AAA no bruit.  No HSM or HJR Distal pulses intact with no bruits RLE varicosities with edema Neuro non-focal Skin warm and dry No muscular weakness   Current Outpatient Prescriptions  Medication Sig Dispense Refill  . amiodarone (PACERONE) 200 MG tablet Take 1 tablet (200 mg total) by mouth daily.  30 tablet  1  . betamethasone dipropionate (DIPROLENE) 0.05 % cream Apply 1 application topically 2 (two) times daily as needed.       . carvedilol (COREG) 12.5 MG tablet Take 1 tablet (12.5 mg total) by mouth 2 (two) times daily.  60 tablet  6  . clopidogrel (PLAVIX) 75 MG tablet Take 1 tablet (75 mg total) by mouth daily. Stop pradaxa  90 tablet  3  . furosemide (LASIX) 40 MG tablet Take 40 mg bid, alternating with tid every other day  78 tablet  6  . insulin glargine (LANTUS) 100 UNIT/ML injection Inject 60-64 Units into the skin at bedtime.       . metolazone (ZAROXOLYN) 5 MG tablet Take 1 tablet (5 mg total) by mouth daily.  30 tablet  6  . potassium chloride (KLOR-CON) 10 MEQ CR tablet Take 15 mEq by mouth daily.        . pravastatin (PRAVACHOL) 20 MG tablet Take 40  mg by mouth at bedtime.       . ramipril (ALTACE) 10 MG capsule Take 20 mg by mouth daily.        No current facility-administered medications for this visit.    Allergies  Cephalexin and Promethazine hcl  Electrocardiogram:  SR PAC IVCD  Assessment and Plan

## 2012-09-10 NOTE — Patient Instructions (Addendum)
Your physician recommends that you schedule a follow-up appointment in: 6 months  

## 2012-09-10 NOTE — Assessment & Plan Note (Signed)
Well controlled.  Continue current medications and low sodium Dash type diet.    

## 2012-09-10 NOTE — Assessment & Plan Note (Signed)
Compensated At dry weight Compliant with meds Stable

## 2012-09-10 NOTE — Assessment & Plan Note (Signed)
Maint NSR continue anticoagulaiton and low dose amiodarone

## 2012-09-11 ENCOUNTER — Other Ambulatory Visit: Payer: Self-pay

## 2012-10-19 ENCOUNTER — Other Ambulatory Visit: Payer: Self-pay | Admitting: Adult Health

## 2012-10-23 ENCOUNTER — Other Ambulatory Visit: Payer: Self-pay | Admitting: Cardiovascular Disease

## 2012-10-27 ENCOUNTER — Other Ambulatory Visit: Payer: Self-pay | Admitting: Adult Health

## 2012-11-18 ENCOUNTER — Other Ambulatory Visit: Payer: Self-pay | Admitting: Cardiovascular Disease

## 2012-11-28 ENCOUNTER — Other Ambulatory Visit: Payer: Self-pay | Admitting: Cardiovascular Disease

## 2012-12-12 ENCOUNTER — Other Ambulatory Visit: Payer: Self-pay

## 2013-01-07 ENCOUNTER — Other Ambulatory Visit: Payer: Self-pay | Admitting: Cardiovascular Disease

## 2013-01-12 IMAGING — CR DG CHEST 2V
2 series · 2 of 2 positions shown · non-contrast
Comparison: 04/16/2007

CLINICAL DATA: Shortness of breath and cough.

CHEST - 2 VIEW

[view not recorded (1 of 2)]
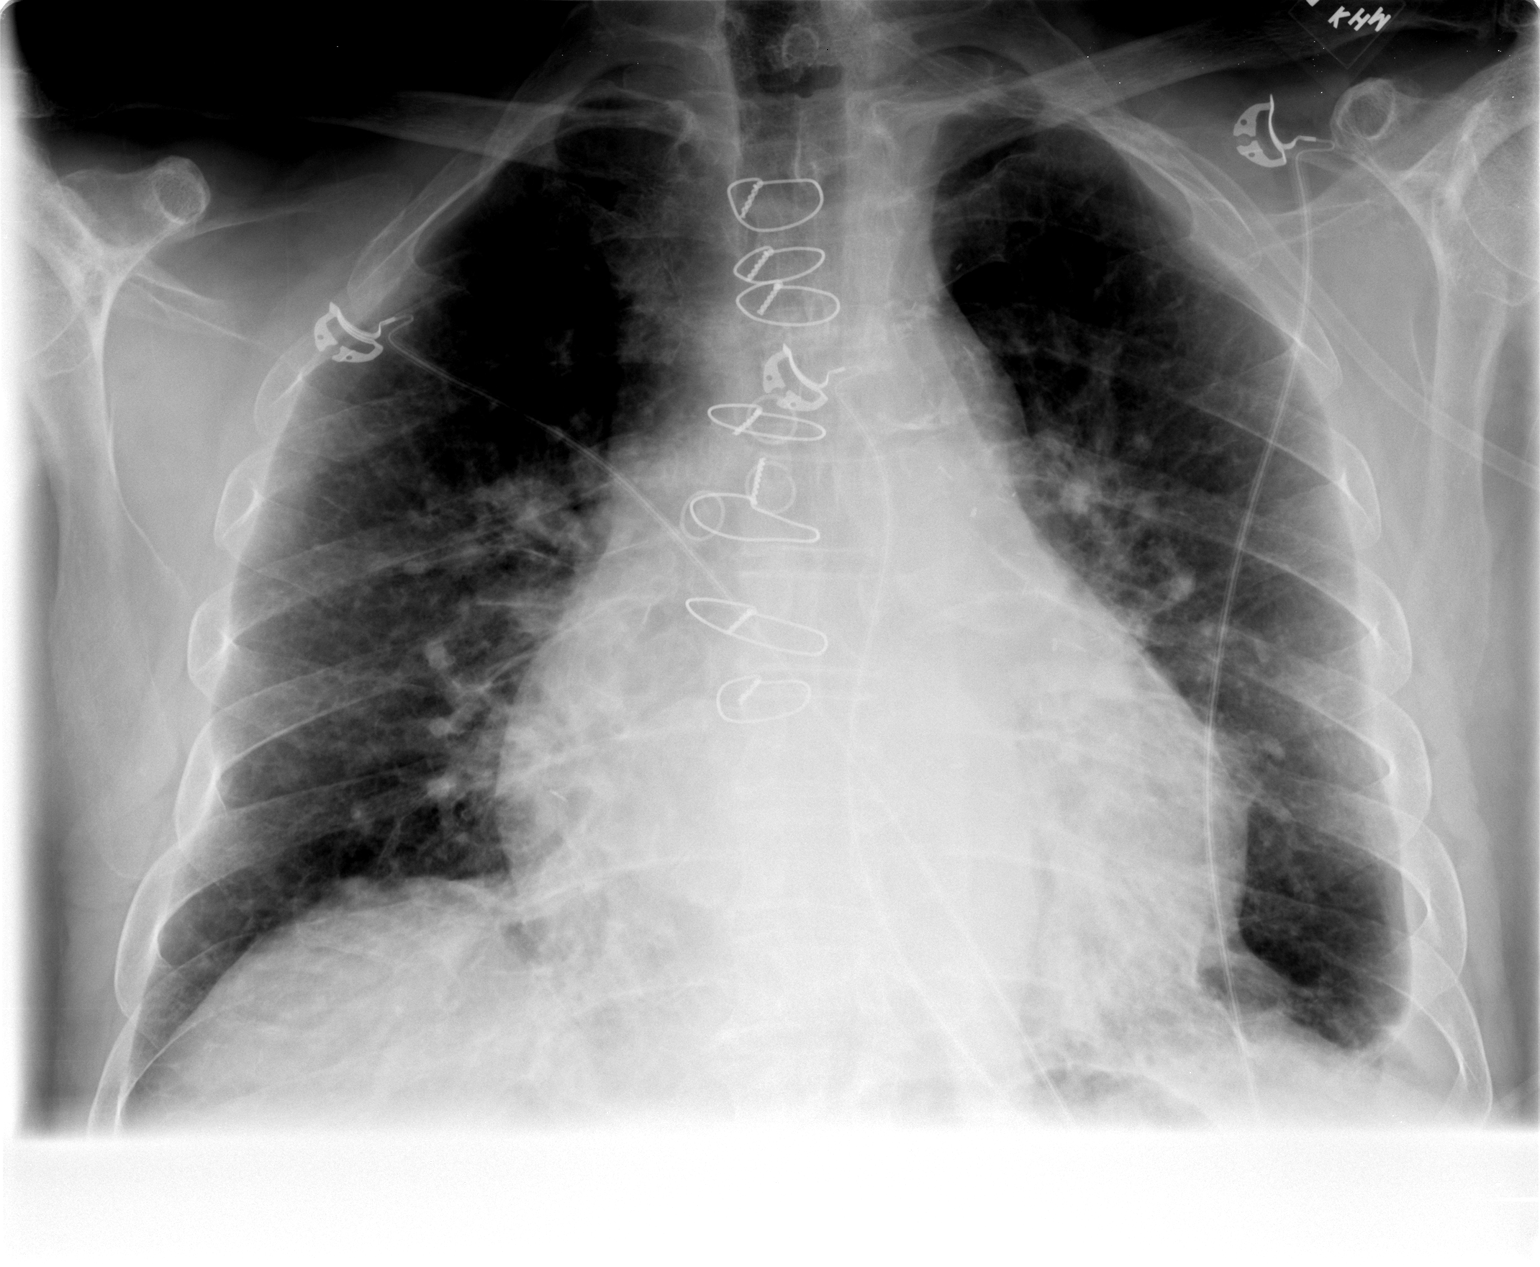

[view not recorded (2 of 2)]
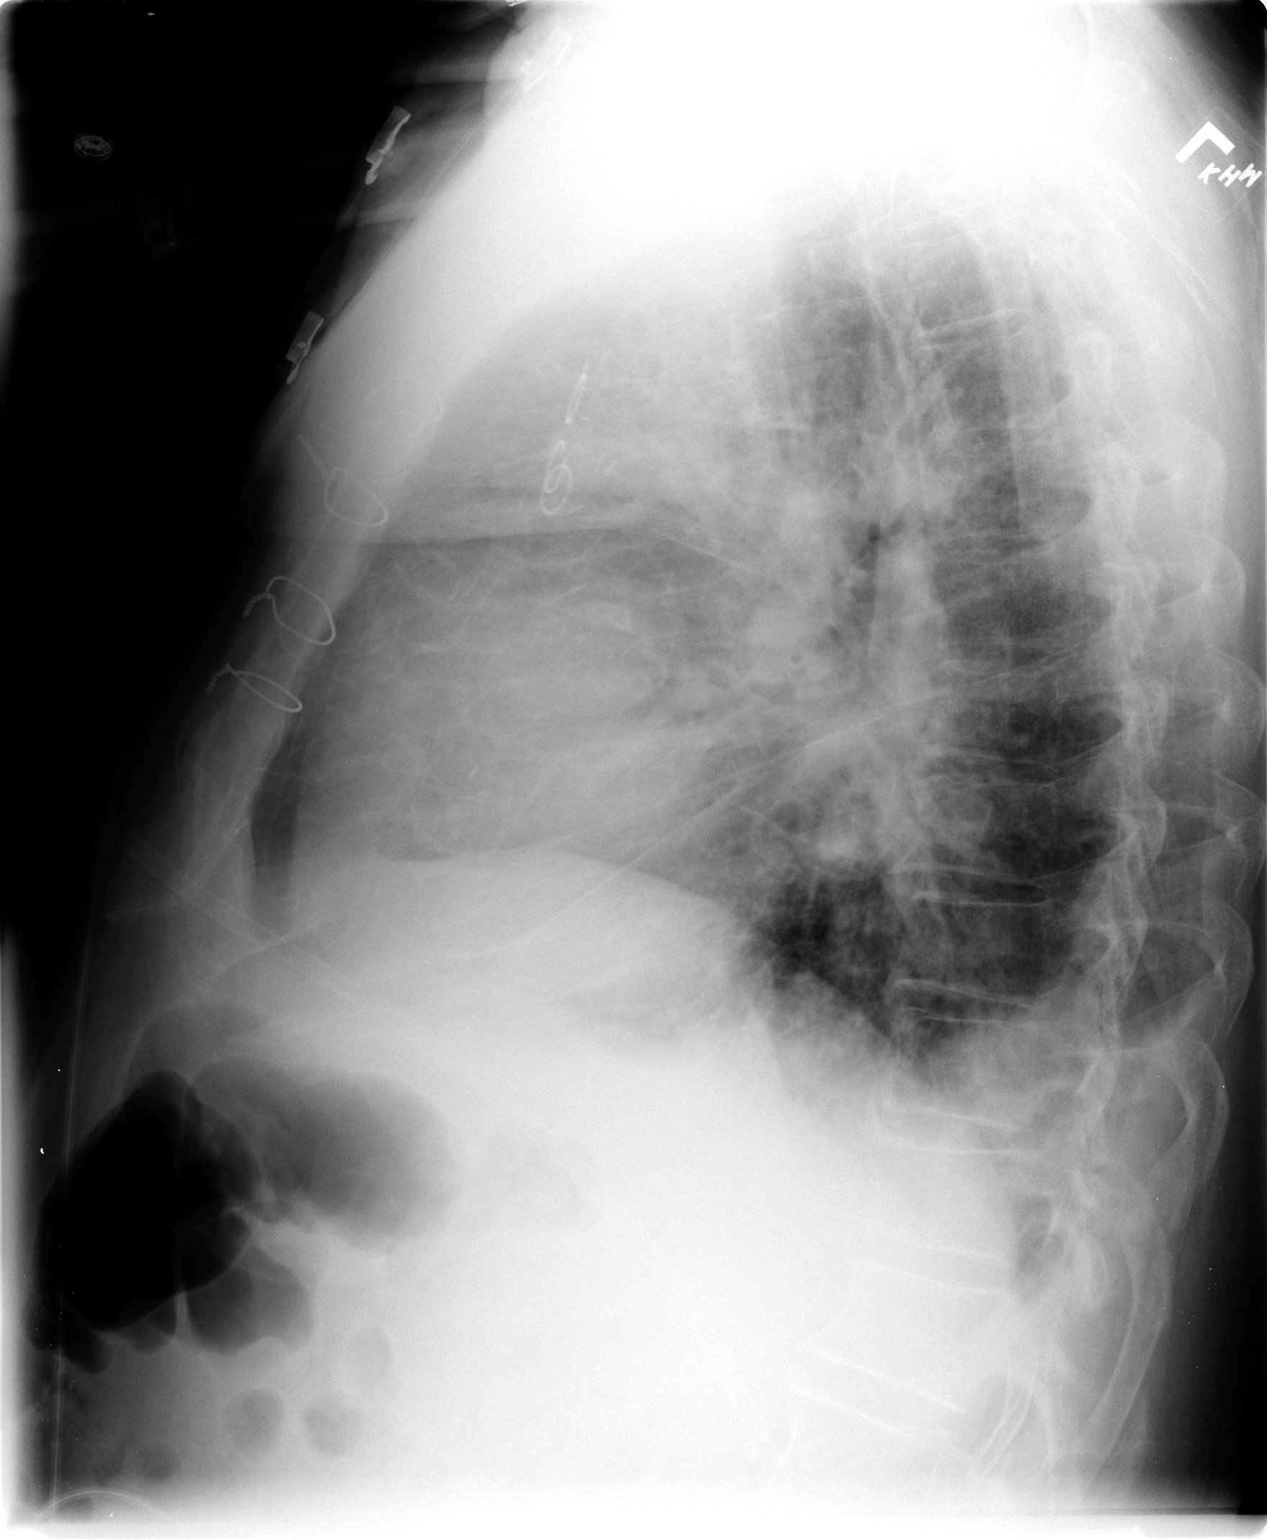

[2 of 2 positions shown; findings below may reference images not displayed]

FINDINGS: Trachea is midline.  Heart is enlarged, stable.  Thoracic
aorta is calcified.  Small left pleural effusion.  Mild central
pulmonary vascular congestion without overt edema.
IMPRESSION: Mild central pulmonary vascular congestion and small left pleural
effusion.

## 2013-02-04 ENCOUNTER — Other Ambulatory Visit: Payer: Self-pay | Admitting: Cardiovascular Disease

## 2013-03-01 ENCOUNTER — Other Ambulatory Visit: Payer: Self-pay | Admitting: Cardiovascular Disease

## 2013-03-09 ENCOUNTER — Other Ambulatory Visit: Payer: Self-pay | Admitting: Cardiovascular Disease

## 2013-03-21 ENCOUNTER — Ambulatory Visit (INDEPENDENT_AMBULATORY_CARE_PROVIDER_SITE_OTHER): Payer: Medicare Other | Admitting: Cardiovascular Disease

## 2013-03-21 ENCOUNTER — Ambulatory Visit: Payer: Medicare Other | Admitting: Cardiovascular Disease

## 2013-03-21 ENCOUNTER — Encounter: Payer: Self-pay | Admitting: Cardiovascular Disease

## 2013-03-21 VITALS — BP 130/65 | HR 59 | Ht 72.0 in | Wt 224.0 lb

## 2013-03-21 DIAGNOSIS — R0602 Shortness of breath: Secondary | ICD-10-CM

## 2013-03-21 DIAGNOSIS — I10 Essential (primary) hypertension: Secondary | ICD-10-CM

## 2013-03-21 DIAGNOSIS — I519 Heart disease, unspecified: Secondary | ICD-10-CM

## 2013-03-21 LAB — BASIC METABOLIC PANEL
BUN: 21 mg/dL (ref 6–23)
CHLORIDE: 99 meq/L (ref 96–112)
CO2: 32 meq/L (ref 19–32)
CREATININE: 1.48 mg/dL — AB (ref 0.50–1.35)
Calcium: 9.3 mg/dL (ref 8.4–10.5)
Glucose, Bld: 294 mg/dL — ABNORMAL HIGH (ref 70–99)
Potassium: 3.9 mEq/L (ref 3.5–5.3)
Sodium: 137 mEq/L (ref 135–145)

## 2013-03-21 NOTE — Assessment & Plan Note (Signed)
Stable with no angina and good activity level.  Continue medical Rx  

## 2013-03-21 NOTE — Assessment & Plan Note (Signed)
Appears euvolemic Dyspnea baseline Check BNP and BMET

## 2013-03-21 NOTE — Assessment & Plan Note (Signed)
Venous insuf and varicosities right leg worse than less Support hose  Conitnue diuretic  Not a great candidate for scleroRx

## 2013-03-21 NOTE — Progress Notes (Signed)
Patient ID: Phillip Henderson, male   DOB: 1934/12/17, 79 y.o.   MRN: [id number] Phillip Henderson is a 79 y/o patient we are following for ongoing assessment and treatment of ischemic CM with EF of 25%, hypercholesterolemia, and systolic CHF. He was last seen one week ago with fluid retention, decompensation of CHF. He had gained a total of 7 lbs since last office visit, and a total of 25 lbs from dry wt. He refuses further cardiac testing, or ICD pacemaker. On last visit we made medication changes to include stopping cardizem, and metoprolol, started coreg 12.5 mg BID, and had lasix increased to 40 mg BID. He has 3 days of metolazone. He has seen his PCP Dr, Phillip Henderson and metolazone was stopped after two days due to nausea. He has lost 13 lbs since last visit, but now has complaints of constipation and fullness in his abdomen with burping and gas. Breathing is much better.  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   Hosptalized in May 2012 at [facility] for CHF Found to be in afib. Subsequently anticoagulated and had TEE/DCC on July 3rd. Has been on amiodarone 400 bid to help maintain NSR   No issues Chronic LE venous disease and will not wear support hose    ROS: Denies fever, malais, weight loss, blurry vision, decreased visual acuity, cough, sputum, SOB, hemoptysis, pleuritic pain, palpitaitons, heartburn, abdominal pain, melena, lower extremity edema, claudication, or rash.  All other systems reviewed and negative  General: Affect appropriate Healthy:  appears stated age HEENT: normal Neck supple with no adenopathy JVP normal no bruits no thyromegaly Lungs  clear with no wheezing and good diaphragmatic motion Heart:  S1/S2 SEM  murmur, no rub, gallop or click PMI normal Abdomen: benighn, BS positve, no tenderness, no AAA no bruit.  No HSM or HJR Distal pulses intact with no bruits Plus 1-2 bilateral  Edema with marked varicosities Neuro non-focal Skin warm and dry No muscular weakness   Current Outpatient Prescriptions  Medication Sig Dispense Refill  . betamethasone dipropionate (DIPROLENE) 0.05 % cream Apply 1 application topically 2 (two) times daily as needed.       . carvedilol (COREG) 12.5 MG tablet TAKE ONE TABLET BY MOUTH TWICE DAILY  60 tablet  5  . clopidogrel (PLAVIX) 75 MG tablet TAKE ONE TABLET BY MOUTH DAILY (STOP PRADAXA)  30 tablet  6  . furosemide (LASIX) 40 MG tablet TAKE 1 TABLET BY MOUTH TWICE DAILY* ALTERNATE THREE TIMES DAILY  78 tablet  1  . insulin glargine (LANTUS) 100 UNIT/ML injection Inject 60-64 Units into the skin at bedtime.       . metolazone (ZAROXOLYN) 5 MG tablet Take 1 tablet (5 mg total) by mouth daily.  30 tablet  6  . PACERONE 200 MG tablet TAKE ONE TABLET BY MOUTH EVERY DAY  30 tablet  36  . potassium chloride (K-DUR,KLOR-CON) 10 MEQ tablet TAKE 1 1/2 TABLET BY MOUTH EVERY NIGHT AT BEDTIME  45 tablet  4  . potassium chloride (KLOR-CON) 10 MEQ CR tablet Take 15 mEq by mouth daily.        . pravastatin (PRAVACHOL) 20 MG tablet Take  40 mg by mouth at bedtime.       . ramipril (ALTACE) 10 MG capsule Take 20 mg by mouth daily.        No current facility-administered medications for this visit.    Allergies  Cephalexin and Promethazine hcl  Electrocardiogram:  SR rate 59 P wave seen V4  IVCD limb lead reversal   Assessment and Plan

## 2013-03-21 NOTE — Assessment & Plan Note (Signed)
Cholesterol is at goal.  Continue current dose of statin and diet Rx.  No myalgias or side effects.  F/U  LFT's in 6 months. Lab Results  Component Value Date   LDLCALC  Value: 104        Total Cholesterol/HDL:CHD Risk Coronary Heart Disease Risk Table                     Men   Women  1/2 Average Risk   3.4   3.3  Average Risk       5.0   4.4  2 X Average Risk   9.6   7.1  3 X Average Risk  23.4   11.0        Use the calculated Patient Ratio above and the CHD Risk Table to determine the patient's CHD Risk.        ATP III CLASSIFICATION (LDL):  <100     mg/dL   Optimal  797-282  mg/dL   Near or Above                    Optimal  130-159  mg/dL   Borderline  060-156  mg/dL   High  >153     mg/dL   Very High* 08/14/4325

## 2013-03-21 NOTE — Patient Instructions (Addendum)
Your physician wants you to follow-up in: 6 months  You will receive a reminder letter in the mail two months in advance. If you don't receive a letter, please call our office to schedule the follow-up appointment.  Your physician recommends that you continue on your current medications as directed. Please refer to the Current Medication list given to you today.  

## 2013-03-21 NOTE — Assessment & Plan Note (Signed)
Well controlled.  Continue current medications and low sodium Dash type diet.    

## 2013-03-21 NOTE — Assessment & Plan Note (Signed)
Maint NSR ECG hard to see P waves but regular  No presyncope or light headedness Check K today

## 2013-03-22 LAB — BRAIN NATRIURETIC PEPTIDE: Brain Natriuretic Peptide: 247.9 pg/mL — ABNORMAL HIGH (ref 0.0–100.0)

## 2013-04-01 ENCOUNTER — Other Ambulatory Visit: Payer: Self-pay | Admitting: Cardiovascular Disease

## 2013-05-05 ENCOUNTER — Other Ambulatory Visit: Payer: Self-pay | Admitting: Cardiovascular Disease

## 2013-07-02 ENCOUNTER — Other Ambulatory Visit: Payer: Self-pay | Admitting: Cardiovascular Disease

## 2013-07-08 ENCOUNTER — Other Ambulatory Visit: Payer: Self-pay | Admitting: Cardiovascular Disease

## 2013-08-23 ENCOUNTER — Inpatient Hospital Stay (HOSPITAL_COMMUNITY)
Admission: EM | Admit: 2013-08-23 | Discharge: 2013-08-27 | DRG: 602 | Disposition: A | Discharge status: Home Health | Payer: Medicare Other | Attending: Internal Medicine | Admitting: Internal Medicine

## 2013-08-23 ENCOUNTER — Emergency Department (HOSPITAL_COMMUNITY): Payer: Medicare Other

## 2013-08-23 ENCOUNTER — Encounter (HOSPITAL_COMMUNITY): Payer: Self-pay | Admitting: Emergency Medicine

## 2013-08-23 DIAGNOSIS — I509 Heart failure, unspecified: Secondary | ICD-10-CM | POA: Diagnosis present

## 2013-08-23 DIAGNOSIS — I428 Other cardiomyopathies: Secondary | ICD-10-CM | POA: Diagnosis present

## 2013-08-23 DIAGNOSIS — E119 Type 2 diabetes mellitus without complications: Secondary | ICD-10-CM | POA: Diagnosis present

## 2013-08-23 DIAGNOSIS — I252 Old myocardial infarction: Secondary | ICD-10-CM

## 2013-08-23 DIAGNOSIS — I129 Hypertensive chronic kidney disease with stage 1 through stage 4 chronic kidney disease, or unspecified chronic kidney disease: Secondary | ICD-10-CM | POA: Diagnosis present

## 2013-08-23 DIAGNOSIS — M7020 Olecranon bursitis, unspecified elbow: Secondary | ICD-10-CM | POA: Diagnosis present

## 2013-08-23 DIAGNOSIS — Z5181 Encounter for therapeutic drug level monitoring: Secondary | ICD-10-CM

## 2013-08-23 DIAGNOSIS — K219 Gastro-esophageal reflux disease without esophagitis: Secondary | ICD-10-CM | POA: Diagnosis present

## 2013-08-23 DIAGNOSIS — Z7901 Long term (current) use of anticoagulants: Secondary | ICD-10-CM

## 2013-08-23 DIAGNOSIS — E785 Hyperlipidemia, unspecified: Secondary | ICD-10-CM | POA: Diagnosis present

## 2013-08-23 DIAGNOSIS — I2510 Atherosclerotic heart disease of native coronary artery without angina pectoris: Secondary | ICD-10-CM | POA: Diagnosis present

## 2013-08-23 DIAGNOSIS — Z87891 Personal history of nicotine dependence: Secondary | ICD-10-CM | POA: Diagnosis not present

## 2013-08-23 DIAGNOSIS — R0602 Shortness of breath: Secondary | ICD-10-CM

## 2013-08-23 DIAGNOSIS — M79609 Pain in unspecified limb: Secondary | ICD-10-CM | POA: Diagnosis present

## 2013-08-23 DIAGNOSIS — I5023 Acute on chronic systolic (congestive) heart failure: Secondary | ICD-10-CM | POA: Diagnosis present

## 2013-08-23 DIAGNOSIS — E876 Hypokalemia: Secondary | ICD-10-CM | POA: Diagnosis present

## 2013-08-23 DIAGNOSIS — T502X5A Adverse effect of carbonic-anhydrase inhibitors, benzothiadiazides and other diuretics, initial encounter: Secondary | ICD-10-CM | POA: Diagnosis present

## 2013-08-23 DIAGNOSIS — IMO0002 Reserved for concepts with insufficient information to code with codable children: Secondary | ICD-10-CM | POA: Diagnosis present

## 2013-08-23 DIAGNOSIS — I959 Hypotension, unspecified: Secondary | ICD-10-CM | POA: Diagnosis present

## 2013-08-23 DIAGNOSIS — I4891 Unspecified atrial fibrillation: Secondary | ICD-10-CM | POA: Diagnosis present

## 2013-08-23 DIAGNOSIS — Z794 Long term (current) use of insulin: Secondary | ICD-10-CM | POA: Diagnosis not present

## 2013-08-23 DIAGNOSIS — L03113 Cellulitis of right upper limb: Secondary | ICD-10-CM

## 2013-08-23 DIAGNOSIS — L0390 Cellulitis, unspecified: Secondary | ICD-10-CM | POA: Diagnosis present

## 2013-08-23 DIAGNOSIS — N179 Acute kidney failure, unspecified: Secondary | ICD-10-CM | POA: Diagnosis present

## 2013-08-23 DIAGNOSIS — N189 Chronic kidney disease, unspecified: Secondary | ICD-10-CM | POA: Diagnosis present

## 2013-08-23 DIAGNOSIS — I10 Essential (primary) hypertension: Secondary | ICD-10-CM

## 2013-08-23 DIAGNOSIS — Z951 Presence of aortocoronary bypass graft: Secondary | ICD-10-CM

## 2013-08-23 DIAGNOSIS — R609 Edema, unspecified: Secondary | ICD-10-CM

## 2013-08-23 DIAGNOSIS — E7800 Pure hypercholesterolemia, unspecified: Secondary | ICD-10-CM | POA: Diagnosis present

## 2013-08-23 DIAGNOSIS — L03119 Cellulitis of unspecified part of limb: Secondary | ICD-10-CM

## 2013-08-23 LAB — BASIC METABOLIC PANEL
ANION GAP: 13 (ref 5–15)
BUN: 24 mg/dL — ABNORMAL HIGH (ref 6–23)
CALCIUM: 9.2 mg/dL (ref 8.4–10.5)
CO2: 30 mEq/L (ref 19–32)
CREATININE: 1.68 mg/dL — AB (ref 0.50–1.35)
Chloride: 93 mEq/L — ABNORMAL LOW (ref 96–112)
GFR calc non Af Amer: 38 mL/min — ABNORMAL LOW (ref >90)
GFR, EST AFRICAN AMERICAN: 44 mL/min — AB (ref >90)
Glucose, Bld: 220 mg/dL — ABNORMAL HIGH (ref 70–99)
Potassium: 3.7 mEq/L (ref 3.7–5.3)
Sodium: 136 mEq/L — ABNORMAL LOW (ref 137–147)

## 2013-08-23 LAB — CBC WITH DIFFERENTIAL/PLATELET
BASOS ABS: 0 10*3/uL (ref 0.0–0.1)
BASOS PCT: 0 % (ref 0–1)
EOS PCT: 0 % (ref 0–5)
Eosinophils Absolute: 0 10*3/uL (ref 0.0–0.7)
HEMATOCRIT: 37.6 % — AB (ref 39.0–52.0)
Hemoglobin: 12.6 g/dL — ABNORMAL LOW (ref 13.0–17.0)
Lymphocytes Relative: 8 % — ABNORMAL LOW (ref 12–46)
Lymphs Abs: 0.9 10*3/uL (ref 0.7–4.0)
MCH: 30 pg (ref 26.0–34.0)
MCHC: 33.5 g/dL (ref 30.0–36.0)
MCV: 89.5 fL (ref 78.0–100.0)
Monocytes Absolute: 0.8 10*3/uL (ref 0.1–1.0)
Monocytes Relative: 7 % (ref 3–12)
Neutro Abs: 9.6 10*3/uL — ABNORMAL HIGH (ref 1.7–7.7)
Neutrophils Relative %: 85 % — ABNORMAL HIGH (ref 43–77)
Platelets: 126 10*3/uL — ABNORMAL LOW (ref 150–400)
RBC: 4.2 MIL/uL — ABNORMAL LOW (ref 4.22–5.81)
RDW: 15.5 % (ref 11.5–15.5)
WBC: 11.4 10*3/uL — ABNORMAL HIGH (ref 4.0–10.5)

## 2013-08-23 LAB — GLUCOSE, CAPILLARY: Glucose-Capillary: 279 mg/dL — ABNORMAL HIGH (ref 70–99)

## 2013-08-23 LAB — PRO B NATRIURETIC PEPTIDE: Pro B Natriuretic peptide (BNP): 2796 pg/mL — ABNORMAL HIGH (ref 0–450)

## 2013-08-23 MED ORDER — HYDROCODONE-ACETAMINOPHEN 5-325 MG PO TABS: 1.0000 | ORAL_TABLET | Freq: Four times a day (QID) | ORAL | Status: DC | PRN

## 2013-08-23 MED ORDER — AMIODARONE HCL 200 MG PO TABS
200.0000 mg | ORAL_TABLET | Freq: Every day | ORAL | Status: DC
  Administered 2013-08-24 – 2013-08-27 (×4): 200 mg via ORAL
  Filled 2013-08-23 (×4): qty 1

## 2013-08-23 MED ORDER — SODIUM CHLORIDE 0.9 % IV BOLUS (SEPSIS)
250.0000 mL | Freq: Once | INTRAVENOUS | Status: AC
  Administered 2013-08-23: 250 mL via INTRAVENOUS

## 2013-08-23 MED ORDER — SIMVASTATIN 20 MG PO TABS
40.0000 mg | ORAL_TABLET | Freq: Every day | ORAL | Status: DC
  Administered 2013-08-23: 40 mg via ORAL
  Filled 2013-08-23: qty 2

## 2013-08-23 MED ORDER — CLOPIDOGREL BISULFATE 75 MG PO TABS
75.0000 mg | ORAL_TABLET | Freq: Every day | ORAL | Status: DC
  Administered 2013-08-24 – 2013-08-25 (×2): 75 mg via ORAL
  Filled 2013-08-23 (×2): qty 1

## 2013-08-23 MED ORDER — PIPERACILLIN-TAZOBACTAM 3.375 G IVPB
INTRAVENOUS | Status: AC
  Filled 2013-08-23: qty 50

## 2013-08-23 MED ORDER — VANCOMYCIN HCL 10 G IV SOLR
1500.0000 mg | INTRAVENOUS | Status: DC
  Administered 2013-08-24 – 2013-08-27 (×4): 1500 mg via INTRAVENOUS
  Filled 2013-08-23 (×5): qty 1500

## 2013-08-23 MED ORDER — SODIUM CHLORIDE 0.45 % IV SOLN
INTRAVENOUS | Status: DC
  Administered 2013-08-23 – 2013-08-24 (×2): via INTRAVENOUS

## 2013-08-23 MED ORDER — FUROSEMIDE 10 MG/ML IJ SOLN
40.0000 mg | Freq: Two times a day (BID) | INTRAMUSCULAR | Status: DC
  Administered 2013-08-23: 40 mg via INTRAVENOUS
  Filled 2013-08-23 (×2): qty 4

## 2013-08-23 MED ORDER — SODIUM CHLORIDE 0.9 % IV SOLN
INTRAVENOUS | Status: DC
  Administered 2013-08-24: via INTRAVENOUS

## 2013-08-23 MED ORDER — ONDANSETRON HCL 4 MG PO TABS
4.0000 mg | ORAL_TABLET | Freq: Four times a day (QID) | ORAL | Status: DC | PRN
Start: 2013-08-23 — End: 2013-08-27

## 2013-08-23 MED ORDER — VANCOMYCIN HCL IN DEXTROSE 1-5 GM/200ML-% IV SOLN
1000.0000 mg | Freq: Once | INTRAVENOUS | Status: AC
  Administered 2013-08-23: 1000 mg via INTRAVENOUS
  Filled 2013-08-23: qty 200

## 2013-08-23 MED ORDER — HEPARIN SODIUM (PORCINE) 5000 UNIT/ML IJ SOLN
5000.0000 [IU] | Freq: Three times a day (TID) | INTRAMUSCULAR | Status: DC
  Administered 2013-08-23 – 2013-08-27 (×11): 5000 [IU] via SUBCUTANEOUS
  Filled 2013-08-23 (×10): qty 1

## 2013-08-23 MED ORDER — INSULIN ASPART 100 UNIT/ML ~~LOC~~ SOLN
0.0000 [IU] | Freq: Every day | SUBCUTANEOUS | Status: DC
  Administered 2013-08-23 – 2013-08-24 (×2): 3 [IU] via SUBCUTANEOUS

## 2013-08-23 MED ORDER — POTASSIUM CHLORIDE CRYS ER 20 MEQ PO TBCR
40.0000 meq | EXTENDED_RELEASE_TABLET | Freq: Every day | ORAL | Status: DC
  Administered 2013-08-23 – 2013-08-25 (×3): 40 meq via ORAL
  Filled 2013-08-23 (×3): qty 2

## 2013-08-23 MED ORDER — INSULIN ASPART 100 UNIT/ML ~~LOC~~ SOLN
0.0000 [IU] | Freq: Three times a day (TID) | SUBCUTANEOUS | Status: DC
  Administered 2013-08-24 (×2): 5 [IU] via SUBCUTANEOUS
  Administered 2013-08-24: 8 [IU] via SUBCUTANEOUS
  Administered 2013-08-25: 3 [IU] via SUBCUTANEOUS
  Administered 2013-08-25: 5 [IU] via SUBCUTANEOUS
  Administered 2013-08-25 – 2013-08-26 (×3): 3 [IU] via SUBCUTANEOUS
  Administered 2013-08-27: 2 [IU] via SUBCUTANEOUS
  Administered 2013-08-27: 3 [IU] via SUBCUTANEOUS

## 2013-08-23 MED ORDER — ONDANSETRON HCL 4 MG/2ML IJ SOLN: 4.0000 mg | Freq: Four times a day (QID) | INTRAMUSCULAR | Status: DC | PRN

## 2013-08-23 MED ORDER — RAMIPRIL 5 MG PO CAPS
20.0000 mg | ORAL_CAPSULE | Freq: Every day | ORAL | Status: DC
  Administered 2013-08-26 – 2013-08-27 (×2): 20 mg via ORAL
  Filled 2013-08-23 (×2): qty 4

## 2013-08-23 MED ORDER — PIPERACILLIN-TAZOBACTAM 3.375 G IVPB
3.3750 g | Freq: Three times a day (TID) | INTRAVENOUS | Status: DC
  Administered 2013-08-23 – 2013-08-24 (×3): 3.375 g via INTRAVENOUS
  Filled 2013-08-23 (×6): qty 50

## 2013-08-23 NOTE — H&P (Signed)
[facility] Admission History and Physical   Patient name: Phillip Henderson Medical record number: [id number] Date of birth: 1934-05-05 Age: 79 y.o. Gender: male  Primary Care Provider: Kirk Ruths, MD Consultants: none Code Status: full  Chief Complaint: R arm pain  Assessment and Plan: [name] is a 79 y.o. male presenting with R arm cellulitis and CHF exacerbation. PMH is significant for DM, HTN, CAD s/p CABG, HLD, GERD, Afib  Celluilitis: rapidly progressing cellulitis of the R elbow concerning for MRSA type infection. No response to Augmentin. Started on Vanc in ED. No sign of abscess or joint involvement. New lines of demarcation drawn. Hypotension likely from EF 25% while on BP meds not shock or SIRS.  - Admit to Tele (CHF exacerbation) - Continue Vanc and add Zos (in setting of DM) - Vicodin  CV/HTN/CAD/Afib/CHF exacerbation: systolic type. BNP 2796 on admission. EF of 25% per last cardiology note (03/21/13). Currently hypotensive Last echo in march per pt.  - tele - IV lasix 40mg  BID (home dose of 40mg  PO) - Strict I/O - daily wts - continue home ACE, hold bblocker during acute exacerbation and in setting of hypotension - continue home Plavix - continue amiodarone - continue home Kdur - BMETin am - EKG - continue statin  AoCKD: Cr 1.68 on admission. Baseline around 1.4. Bump likely from dehydration and increased diuretics. Will need to monitor closely due to renal effects of Vanc and active diuresis.  - BMET in the am - consider renal consult if worsens.   DM: home lantus of 60-64 units QHS.  - hold home lantus - SSI moderate.  - A1c  FEN/GI: heart healthy diet - KVO 1/2NS  Prophylaxis: Hep Dana TIC  Disposition: pending clinical improvement.    History of Present Illness: Phillip Henderson is a 79 y.o. male presenting with R arm became itchy 3-4 days ago while being outside. Pt thinks he experienced bug bites. Arm started to swell and become  painful 3 days ago. Went to PCP yesterday adn started on Augmentin.  Also complainin of progressive SOB adn LE swelling since viral URI 2 wks ago. Graduatl wt gain during this time. Took 2 doses of metolazone today w/ some improvement  Review Of Systems: Per HPI with the following additions: none Otherwise 12 point review of systems was performed and was unremarkable.  Patient Active Problem List   Diagnosis Date Noted  . Anticoagulation management encounter 11/08/2010  . A-fib 07/28/2010  . CHF 08/10/2009  . DIABETES MELLITUS 06/24/2008  . HYPERCHOLESTEROLEMIA 06/24/2008  . HYPERTENSION 06/24/2008  . CAD 06/24/2008  . CARDIOMYOPATHY 06/24/2008  . EDEMA 06/24/2008  . DYSPNEA 06/24/2008   Past Medical History: Past Medical History  Diagnosis Date  . CAD (coronary artery disease)     ant MI 43. CABG 2001 [place] D1 SVG LAD SVG om SVG PDA EF 30%  . CHF (congestive heart failure)   . Diabetes mellitus     mellitus? lantus at night  . Hyperlipidemia    Past Surgical History: Past Surgical History  Procedure Laterality Date  . Coronary artery bypass graft    . Cardiac catheterization     Social History: History  Substance Use Topics  . Smoking status: Former Smoker    Types: Cigarettes    Quit date: 02/06/1970  . Smokeless tobacco: Never Used     Comment: non-smoker  . Alcohol Use: No   Additional social history: reviewed  Please also refer to relevant sections of  EMR.  Family History: No family history on file. Allergies and Medications: Allergies  Allergen Reactions  . Cephalexin   . Promethazine Hcl    No current facility-administered medications on file prior to encounter.   Current Outpatient Prescriptions on File Prior to Encounter  Medication Sig Dispense Refill  . carvedilol (COREG) 12.5 MG tablet TAKE 1 TABLET BY MOUTH TWICE DAILY  60 tablet  6  . clopidogrel (PLAVIX) 75 MG tablet TAKE 1 TABLET BY MOUTH DAILY  90 tablet  3  . insulin glargine (LANTUS)  100 UNIT/ML injection Inject 65-70 Units into the skin at bedtime. Sliding scale based on blood sugar readings      . PACERONE 200 MG tablet TAKE ONE TABLET BY MOUTH EVERY DAY  30 tablet  36  . potassium chloride (KLOR-CON) 10 MEQ CR tablet Take 10 mEq by mouth daily.       . ramipril (ALTACE) 10 MG capsule Take 20 mg by mouth daily.         Objective: BP 108/51  Pulse 55  Temp(Src) 98.9 F (37.2 C) (Oral)  Resp 20  Ht 6' (1.829 m)  Wt 101.606 kg (224 lb)  BMI 30.37 kg/m2  SpO2 93% Exam: General: mild distress.  HEENT: mmm EOMI, PERRL Cardiovascular: RRR, no JVD, no m/r/g Respiratory: basilar crackles on L Abdomen: NABS, non-ttp Extremities: 1+ LE edema Skin: intact Neuro: CN2-12 grossly intact  Labs and Imaging: Results for orders placed during the hospital encounter of 08/23/13 (from the past 24 hour(s))  CBC WITH DIFFERENTIAL     Status: Abnormal   Collection Time    08/23/13  1:45 PM      Result Value Ref Range   WBC 11.4 (*) 4.0 - 10.5 K/uL   RBC 4.20 (*) 4.22 - 5.81 MIL/uL   Hemoglobin 12.6 (*) 13.0 - 17.0 g/dL   HCT 16.1 (*) 09.6 - 04.5 %   MCV 89.5  78.0 - 100.0 fL   MCH 30.0  26.0 - 34.0 pg   MCHC 33.5  30.0 - 36.0 g/dL   RDW 40.9  81.1 - 91.4 %   Platelets 126 (*) 150 - 400 K/uL   Neutrophils Relative % 85 (*) 43 - 77 %   Neutro Abs 9.6 (*) 1.7 - 7.7 K/uL   Lymphocytes Relative 8 (*) 12 - 46 %   Lymphs Abs 0.9  0.7 - 4.0 K/uL   Monocytes Relative 7  3 - 12 %   Monocytes Absolute 0.8  0.1 - 1.0 K/uL   Eosinophils Relative 0  0 - 5 %   Eosinophils Absolute 0.0  0.0 - 0.7 K/uL   Basophils Relative 0  0 - 1 %   Basophils Absolute 0.0  0.0 - 0.1 K/uL  BASIC METABOLIC PANEL     Status: Abnormal   Collection Time    08/23/13  1:45 PM      Result Value Ref Range   Sodium 136 (*) 137 - 147 mEq/L   Potassium 3.7  3.7 - 5.3 mEq/L   Chloride 93 (*) 96 - 112 mEq/L   CO2 30  19 - 32 mEq/L   Glucose, Bld 220 (*) 70 - 99 mg/dL   BUN 24 (*) 6 - 23 mg/dL    Creatinine, Ser 7.82 (*) 0.50 - 1.35 mg/dL   Calcium 9.2  8.4 - 95.6 mg/dL   GFR calc non Af Amer 38 (*) >90 mL/min   GFR calc Af Amer 44 (*) >90 mL/min  Anion gap 13  5 - 15  PRO B NATRIURETIC PEPTIDE     Status: Abnormal   Collection Time    08/23/13  1:45 PM      Result Value Ref Range   Pro B Natriuretic peptide (BNP) 2796.0 (*) 0 - 450 pg/mL    Dg Chest 2 View  08/23/2013   CLINICAL DATA:  Shortness of breath. Possible right arm cellulitis. Prior CABG.  EXAM: CHEST  2 VIEW  COMPARISON:  Two-view chest x-ray 03/14/2012, 07/08/2010.  FINDINGS: Lateral image blurred by patient motion. Prior sternotomy for CABG. Cardiac silhouette moderately enlarged but stable. Chronic pulmonary venous hypertension without overt edema currently. Chronic blunting of the left costophrenic angle. No confluent airspace consolidation. No pneumothorax. Visualized bony thorax intact.  IMPRESSION: No acute cardiopulmonary disease. Stable moderate cardiomegaly and stable chronic pulmonary venous hypertension without overt edema. Stable chronic pleuroparenchymal scarring at the left base.   Electronically Signed   By: Hulan Saashomas  Lawrence M.D.   On: 08/23/2013 14:06   Dg Elbow Complete Right  08/23/2013   CLINICAL DATA:  Right arm pain, redness and swelling.  EXAM: RIGHT ELBOW - COMPLETE 3+ VIEW  COMPARISON:  None.  FINDINGS: No fracture. The elbow joint is normally spaced and aligned. No joint effusion. Subcutaneous soft tissue edema is noted medially and posteriorly. No radiopaque foreign body.  IMPRESSION: No fracture or bone lesion.  No elbow joint abnormality.   Electronically Signed   By: Amie Portlandavid  Ormond M.D.   On: 08/23/2013 14:05     Ozella Rocksavid J Merrell, MD Family Medicine 08/23/2013, 3:40 PM [id number]

## 2013-08-23 NOTE — Progress Notes (Signed)
ANTIBIOTIC CONSULT NOTE - INITIAL  Pharmacy Consult for Vancomycin & Zosyn Indication: cellulitis  Allergies  Allergen Reactions  . Cephalexin   . Promethazine Hcl     Patient Measurements: Height: 6' (182.9 cm) Weight: 223 lb (101.152 kg) IBW/kg (Calculated) : 77.6  Vital Signs: Temp: 98.2 F (36.8 C) (07/18 1652) Temp src: Oral (07/18 1652) BP: 133/56 mmHg (07/18 1652) Pulse Rate: 63 (07/18 1652) Intake/Output from previous day:   Intake/Output from this shift:    Labs:  Recent Labs  08/23/13 1345  WBC 11.4*  HGB 12.6*  PLT 126*  CREATININE 1.68*   Estimated Creatinine Clearance: 46 ml/min (by C-G formula based on Cr of 1.68). No results found for this basename: VANCOTROUGH, VANCOPEAK, VANCORANDOM, GENTTROUGH, GENTPEAK, GENTRANDOM, TOBRATROUGH, TOBRAPEAK, TOBRARND, AMIKACINPEAK, AMIKACINTROU, AMIKACIN,  in the last 72 hours   Microbiology: No results found for this or any previous visit (from the past 720 hour(s)).  Medical History: Past Medical History  Diagnosis Date  . CAD (coronary artery disease)     ant MI 63. CABG 2001 [place] D1 SVG LAD SVG om SVG PDA EF 30%  . CHF (congestive heart failure)   . Diabetes mellitus     mellitus? lantus at night  . Hyperlipidemia     Medications:  Scheduled:  . [START ON 08/24/2013] amiodarone  200 mg Oral Daily  . [START ON 08/24/2013] clopidogrel  75 mg Oral Daily  . furosemide  40 mg Intravenous BID  . heparin  5,000 Units Subcutaneous 3 times per day  . [START ON 08/24/2013] insulin aspart  0-15 Units Subcutaneous TID WC  . insulin aspart  0-5 Units Subcutaneous QHS  . piperacillin-tazobactam (ZOSYN)  IV  3.375 g Intravenous Q8H  . potassium chloride  40 mEq Oral Daily  . ramipril  20 mg Oral Daily  . simvastatin  40 mg Oral q1800   Assessment: 79 yo diabetic M with R arm cellulitis that has worsened on Augmentin.  WBC mildly elevated.   Acute on chronic renal failure noted.  Normalized CrCl ~ 35-40 ml/min.     Vancomycin 7/18>> Zosyn 7/18>>  Goal of Therapy:  Vancomycin trough level 10-15 mcg/ml  Plan:  Zosyn 3.375gm IV Q8h to be infused over 4hrs Vancomycin 1500mg  IV q24h Check Vancomycin trough at steady state Monitor renal function and cx data   Elson Clan 08/23/2013,9:25 PM

## 2013-08-23 NOTE — ED Notes (Signed)
Pt states swelling and redness to right elbow since Thursday. Was seen at [facility] on Friday and was told to come to the ED if swelling and redness became worse. Pt also states a cough with "a little rattling" to his chest.

## 2013-08-23 NOTE — ED Provider Notes (Signed)
CSN: [id number]     Arrival date & time 08/23/13  1228 History   None    This chart was scribed for Vanetta Mulders, MD by Arlan Organ, ED Scribe. This patient was seen in room APA01/APA01 and the patient's care was started 1:13 PM.   Chief Complaint  Patient presents with  . Shortness of Breath  . Cellulitis   Patient is a 79 y.o. male presenting with shortness of breath. The history is provided by the patient. No language interpreter was used.  Shortness of Breath Severity:  Moderate Onset quality:  Gradual Duration:  3 days Timing:  Constant Progression:  Worsening Chronicity:  New Relieved by:  Rest Worsened by:  Movement Associated symptoms: cough, fever, headaches, rash, sore throat and vomiting   Associated symptoms: no abdominal pain and no chest pain   Cough:    Severity:  Mild Fever:    Duration:  1 day   Max temp PTA (F):  100.7   Progression:  Resolved   HPI Comments: Phillip Henderson is a 79 y.o. male who presents to the Emergency Department complaining of possible cellulitis to the R elbow x 3 days that has progressively worsened. Pt has noted constant, moderate erythema and swelling to the area. This pain is exacerbated with all movement and currently rates discomfort 7-8/10. Pain is alleviated without any movement and rest. Pt was evaluated at [facility] on yesterday and treated with an injection. No X-Rays performed at this visit. He was also started on Augmentin at discharge. Pt was advised to come to the ED if symptoms worsened. Denies any previous issues with his elbow. No recent injury or trauma. Daughter reports a fever last night of 100.7 after appointment. However, this has now resolved. At this time he denies any CP, leg swelling, abdominal pain, back pain, or rhinorrhea. No other concerns this visit.  He is followed by Dr. Karleen Hampshire PCP  Past Medical History  Diagnosis Date  . CAD (coronary artery disease)     ant MI 16. CABG 2001 [place] D1 SVG LAD SVG  om SVG PDA EF 30%  . CHF (congestive heart failure)   . Diabetes mellitus     mellitus? lantus at night  . Hyperlipidemia    Past Surgical History  Procedure Laterality Date  . Coronary artery bypass graft    . Cardiac catheterization     No family history on file. History  Substance Use Topics  . Smoking status: Former Smoker    Types: Cigarettes    Quit date: 02/06/1970  . Smokeless tobacco: Never Used     Comment: non-smoker  . Alcohol Use: No    Review of Systems  Constitutional: Positive for fever and chills.  HENT: Positive for congestion and sore throat. Negative for rhinorrhea.   Eyes: Positive for visual disturbance.  Respiratory: Positive for cough and shortness of breath.   Cardiovascular: Negative for chest pain and leg swelling.  Gastrointestinal: Positive for nausea, vomiting and diarrhea. Negative for abdominal pain.  Genitourinary: Negative for dysuria.  Musculoskeletal: Negative for back pain.  Skin: Positive for rash and wound.  Neurological: Positive for headaches. Negative for dizziness and light-headedness.  Hematological: Does not bruise/bleed easily.  Psychiatric/Behavioral: Negative for confusion.      Allergies  Cephalexin and Promethazine hcl  Home Medications   Prior to Admission medications   Medication Sig Start Date End Date Taking? Authorizing Provider  amoxicillin-clavulanate (AUGMENTIN) 875-125 MG per tablet Take 1 tablet by mouth 2 (two)  times daily.   Yes Historical Provider, MD  carvedilol (COREG) 12.5 MG tablet TAKE 1 TABLET BY MOUTH TWICE DAILY   Yes Wendall Stade, MD  clopidogrel (PLAVIX) 75 MG tablet TAKE 1 TABLET BY MOUTH DAILY   Yes Wendall Stade, MD  furosemide (LASIX) 40 MG tablet Take 40 mg by mouth as directed. Takes twice daily and alternates three times daily every other day.   Yes Historical Provider, MD  insulin glargine (LANTUS) 100 UNIT/ML injection Inject 65-70 Units into the skin at bedtime. Sliding scale based  on blood sugar readings   Yes Historical Provider, MD  metolazone (ZAROXOLYN) 5 MG tablet Take 2.5 mg by mouth 2 (two) times daily as needed (for fluid retention). 05/27/12  Yes Wendall Stade, MD  PACERONE 200 MG tablet TAKE ONE TABLET BY MOUTH EVERY DAY 10/23/12  Yes Wendall Stade, MD  potassium chloride (KLOR-CON) 10 MEQ CR tablet Take 10 mEq by mouth daily.    Yes Historical Provider, MD  pravastatin (PRAVACHOL) 40 MG tablet Take 40 mg by mouth at bedtime.   Yes Historical Provider, MD  ramipril (ALTACE) 10 MG capsule Take 20 mg by mouth daily.    Yes Historical Provider, MD   Triage Vitals: BP 116/52  Pulse 53  Temp(Src) 98.9 F (37.2 C) (Oral)  Resp 22  Ht 6' (1.829 m)  Wt 224 lb (101.606 kg)  BMI 30.37 kg/m2  SpO2 94%   Physical Exam  Nursing note and vitals reviewed. Constitutional: He is oriented to person, place, and time. He appears well-developed and well-nourished.  HENT:  Head: Normocephalic and atraumatic.  Eyes: EOM are normal.  Neck: Normal range of motion.  Cardiovascular: Normal rate, regular rhythm, normal heart sounds and intact distal pulses.   No murmur heard. Pulses:      Radial pulses are 2+ on the right side.  Capillary refill to toes less than 1 second   Pulmonary/Chest: Effort normal and breath sounds normal. No respiratory distress.  Abdominal: Soft. Bowel sounds are normal. There is no tenderness.  Musculoskeletal: Normal range of motion. He exhibits no edema.  Varicose veins noted. Predominantly on the R  Neurological: He is alert and oriented to person, place, and time. No cranial nerve deficit. He exhibits normal muscle tone. Coordination normal.  Skin: Skin is warm and dry.  Area of redness on posterior aspect and proxima forearm of R elbow measuring 15 cm Area of redness to anterior aspect of R elbow measuring 15 cm Increased warmth noted 3 skin abrasions measuring 2 mm-4 mm to posterior elbow without any fluctuance, purulent discharge, or  induration  Psychiatric: He has a normal mood and affect. Judgment normal.    ED Course  Procedures (including critical care time)  DIAGNOSTIC STUDIES: Oxygen Saturation is 94% on RA, adequate by my interpretation.    COORDINATION OF CARE: 1:40 PM- Will give fluids. Will order DG Elbow complete R, DG Cest 2 view, CBC, BMP, BNP. Discussed treatment plan with pt at bedside and pt agreed to plan.     Labs Review Labs Reviewed  CBC WITH DIFFERENTIAL - Abnormal; Notable for the following:    WBC 11.4 (*)    RBC 4.20 (*)    Hemoglobin 12.6 (*)    HCT 37.6 (*)    Platelets 126 (*)    Neutrophils Relative % 85 (*)    Neutro Abs 9.6 (*)    Lymphocytes Relative 8 (*)    All other components within normal  limits  BASIC METABOLIC PANEL - Abnormal; Notable for the following:    Sodium 136 (*)    Chloride 93 (*)    Glucose, Bld 220 (*)    BUN 24 (*)    Creatinine, Ser 1.68 (*)    GFR calc non Af Amer 38 (*)    GFR calc Af Amer 44 (*)    All other components within normal limits  PRO B NATRIURETIC PEPTIDE - Abnormal; Notable for the following:    Pro B Natriuretic peptide (BNP) 2796.0 (*)    All other components within normal limits    Imaging Review Dg Chest 2 View  08/23/2013   CLINICAL DATA:  Shortness of breath. Possible right arm cellulitis. Prior CABG.  EXAM: CHEST  2 VIEW  COMPARISON:  Two-view chest x-ray 03/14/2012, 07/08/2010.  FINDINGS: Lateral image blurred by patient motion. Prior sternotomy for CABG. Cardiac silhouette moderately enlarged but stable. Chronic pulmonary venous hypertension without overt edema currently. Chronic blunting of the left costophrenic angle. No confluent airspace consolidation. No pneumothorax. Visualized bony thorax intact.  IMPRESSION: No acute cardiopulmonary disease. Stable moderate cardiomegaly and stable chronic pulmonary venous hypertension without overt edema. Stable chronic pleuroparenchymal scarring at the left base.   Electronically Signed    By: Hulan Saashomas  Lawrence M.D.   On: 08/23/2013 14:06   Dg Elbow Complete Right  08/23/2013   CLINICAL DATA:  Right arm pain, redness and swelling.  EXAM: RIGHT ELBOW - COMPLETE 3+ VIEW  COMPARISON:  None.  FINDINGS: No fracture. The elbow joint is normally spaced and aligned. No joint effusion. Subcutaneous soft tissue edema is noted medially and posteriorly. No radiopaque foreign body.  IMPRESSION: No fracture or bone lesion.  No elbow joint abnormality.   Electronically Signed   By: Amie Portlandavid  Ormond M.D.   On: 08/23/2013 14:05     EKG Interpretation None      MDM   Final diagnoses:  Cellulitis of elbow   Patient with extensive cellulitis to the right elbow. No evidence of joint abnormality or bony involvement. To this is a skin cellulitis. Patient started on Augmentin yesterday no real improvement. Most likely this is a staph aureus-type infection. Patient given one dose of vancomycin here. Does have a leukocytosis of mild hyperglycemia with a blood sugar in the 220 range. Patient nontoxic no acute distress. We'll discuss with the hospitalist about admission. [facility]  I personally performed the services described in this documentation, which was scribed in my presence. The recorded information has been reviewed and is accurate.    Vanetta MuldersScott Tyanna Hach, MD 08/23/13 [phone]

## 2013-08-23 NOTE — ED Notes (Signed)
Admitting MD at bedside.

## 2013-08-24 DIAGNOSIS — I4891 Unspecified atrial fibrillation: Secondary | ICD-10-CM

## 2013-08-24 DIAGNOSIS — I10 Essential (primary) hypertension: Secondary | ICD-10-CM

## 2013-08-24 DIAGNOSIS — IMO0002 Reserved for concepts with insufficient information to code with codable children: Principal | ICD-10-CM

## 2013-08-24 DIAGNOSIS — N179 Acute kidney failure, unspecified: Secondary | ICD-10-CM

## 2013-08-24 DIAGNOSIS — E119 Type 2 diabetes mellitus without complications: Secondary | ICD-10-CM

## 2013-08-24 LAB — HEMOGLOBIN A1C
Hgb A1c MFr Bld: 9.4 % — ABNORMAL HIGH (ref <5.7)
Mean Plasma Glucose: 223 mg/dL — ABNORMAL HIGH (ref <117)

## 2013-08-24 LAB — BASIC METABOLIC PANEL
Anion gap: 16 — ABNORMAL HIGH (ref 5–15)
BUN: 27 mg/dL — AB (ref 6–23)
CALCIUM: 9 mg/dL (ref 8.4–10.5)
CO2: 29 mEq/L (ref 19–32)
Chloride: 88 mEq/L — ABNORMAL LOW (ref 96–112)
Creatinine, Ser: 1.46 mg/dL — ABNORMAL HIGH (ref 0.50–1.35)
GFR, EST AFRICAN AMERICAN: 52 mL/min — AB (ref >90)
GFR, EST NON AFRICAN AMERICAN: 45 mL/min — AB (ref >90)
GLUCOSE: 247 mg/dL — AB (ref 70–99)
POTASSIUM: 3.3 meq/L — AB (ref 3.7–5.3)
Sodium: 133 mEq/L — ABNORMAL LOW (ref 137–147)

## 2013-08-24 LAB — GLUCOSE, CAPILLARY
GLUCOSE-CAPILLARY: 269 mg/dL — AB (ref 70–99)
Glucose-Capillary: 230 mg/dL — ABNORMAL HIGH (ref 70–99)
Glucose-Capillary: 236 mg/dL — ABNORMAL HIGH (ref 70–99)

## 2013-08-24 MED ORDER — INSULIN GLARGINE 100 UNIT/ML ~~LOC~~ SOLN
65.0000 [IU] | Freq: Every day | SUBCUTANEOUS | Status: DC
  Administered 2013-08-24: 65 [IU] via SUBCUTANEOUS
  Filled 2013-08-24: qty 0.65

## 2013-08-24 MED ORDER — POTASSIUM CHLORIDE CRYS ER 20 MEQ PO TBCR
40.0000 meq | EXTENDED_RELEASE_TABLET | Freq: Once | ORAL | Status: AC
Start: 2013-08-24 — End: 2013-08-24
  Administered 2013-08-24: 40 meq via ORAL
  Filled 2013-08-24: qty 2

## 2013-08-24 MED ORDER — ATORVASTATIN CALCIUM 20 MG PO TABS
20.0000 mg | ORAL_TABLET | Freq: Every day | ORAL | Status: DC
  Administered 2013-08-24 – 2013-08-26 (×3): 20 mg via ORAL
  Filled 2013-08-24 (×3): qty 1

## 2013-08-24 MED ORDER — FUROSEMIDE 40 MG PO TABS
40.0000 mg | ORAL_TABLET | Freq: Two times a day (BID) | ORAL | Status: DC
  Administered 2013-08-24 – 2013-08-27 (×7): 40 mg via ORAL
  Filled 2013-08-24 (×7): qty 1

## 2013-08-24 NOTE — Progress Notes (Signed)
[facility] PROGRESS NOTE  Phillip Henderson NBV:[id number] DOB: November 09, 1934 DOA: 08/23/2013 PCP: Kirk Ruths, MD  Assessment/Plan: 1. Right arm cellulitis- continue with vancomycin, will discontinue Zosyn as not much role of Zosyn in cellulitis caused by diabetes mellitus. Same microorganisms have been isolated in both diabetic nondiabetic cellulitis/abscess patients  in recent studies in medical literature. (Comparison of the Microbiology and Antibiotic Treatment Among Diabetic and Nondiabetic Patients Hospitalized for Cellulitis or Cutaneous Abscess. Huey Romans, [facility], et al: [facility]; 1314;3 (December): [phone].) X-ray of the elbow did not show fracture or bone lesion. 2. CHF exacerbation- patient came in CHF exacerbation, diurese very well with IV Lasix. At this time I will switch over to by mouth Lasix.  3. Hypokalemia- secondary to diuresis, we'll replace potassium and check BMP in a.m. 4. Atrial fibrillation- continue him in the long, Plavix 5. Coronary artery disease- continue Plavix, statin, Coreg. 6. Acute on chronic kidney disease- renal function has improved and is almost back to baseline of 1.46 after diuresis. With probable improved blood flow to kidneys. 7. Diabetes mellitus- will restart Lantus 65 units at bedtime, sliding scale insulin with NovoLog 8. DVT prophylaxis-Lovenox   Code Status:  Full code Family Communication: *Discussed with daughter at bedside Disposition Plan: *Home when stable   Consultants:  None  Procedures:  None  Antibiotics:  Vancomycin 7/18  Zosyn 7/18  HPI/Subjective: 79 year old male came with right arm cellulitis, CHF exacerbation. Started on IV antibiotics and IV Lasix. Feels better this morning, no shortness of breath pain and swelling of the arm has reduced.  Objective: Filed Vitals:   08/24/13 0534  BP: 120/48  Pulse: 65  Temp: 98.2 F (36.8 C)  Resp: 20    Intake/Output Summary (Last 24 hours) at 08/24/13  1045 Last data filed at 08/24/13 0939  Gross per 24 hour  Intake 549.67 ml  Output   2700 ml  Net -2150.33 ml   Filed Weights   08/23/13 1234 08/23/13 1652 08/24/13 0534  Weight: 101.606 kg (224 lb) 101.152 kg (223 lb) 100.4 kg (221 lb 5.5 oz)    Exam:  Physical Exam: Head: Normocephalic, atraumatic.  Lungs: Normal respiratory effort. B/L Clear to auscultation, no crackles or wheezes.  Heart: Regular RR. S1 and S2 normal  Abdomen: BS normoactive. Soft, Nondistended, non-tender.  Extremities: Right upper extremity, erythematous, warm to touch, edema noted in the right forearm, mild erythema  Data Reviewed: Basic Metabolic Panel:  Recent Labs Lab 08/23/13 1345 08/24/13 0555  NA 136* 133*  K 3.7 3.3*  CL 93* 88*  CO2 30 29  GLUCOSE 220* 247*  BUN 24* 27*  CREATININE 1.68* 1.46*  CALCIUM 9.2 9.0   Liver Function Tests: No results found for this basename: AST, ALT, ALKPHOS, BILITOT, PROT, ALBUMIN,  in the last 168 hours No results found for this basename: LIPASE, AMYLASE,  in the last 168 hours No results found for this basename: AMMONIA,  in the last 168 hours CBC:  Recent Labs Lab 08/23/13 1345  WBC 11.4*  NEUTROABS 9.6*  HGB 12.6*  HCT 37.6*  MCV 89.5  PLT 126*   Cardiac Enzymes: No results found for this basename: CKTOTAL, CKMB, CKMBINDEX, TROPONINI,  in the last 168 hours BNP (last 3 results)  Recent Labs  08/23/13 1345  PROBNP 2796.0*   CBG:  Recent Labs Lab 08/23/13 2214 08/24/13 0817  GLUCAP 279* 230*    No results found for this or any previous visit (from the past 240 hour(s)).  Studies: Dg Chest 2 View  08/23/2013   CLINICAL DATA:  Shortness of breath. Possible right arm cellulitis. Prior CABG.  EXAM: CHEST  2 VIEW  COMPARISON:  Two-view chest x-ray 03/14/2012, 07/08/2010.  FINDINGS: Lateral image blurred by patient motion. Prior sternotomy for CABG. Cardiac silhouette moderately enlarged but stable. Chronic pulmonary venous  hypertension without overt edema currently. Chronic blunting of the left costophrenic angle. No confluent airspace consolidation. No pneumothorax. Visualized bony thorax intact.  IMPRESSION: No acute cardiopulmonary disease. Stable moderate cardiomegaly and stable chronic pulmonary venous hypertension without overt edema. Stable chronic pleuroparenchymal scarring at the left base.   Electronically Signed   By: Hulan Saashomas  Lawrence M.D.   On: 08/23/2013 14:06   Dg Elbow Complete Right  08/23/2013   CLINICAL DATA:  Right arm pain, redness and swelling.  EXAM: RIGHT ELBOW - COMPLETE 3+ VIEW  COMPARISON:  None.  FINDINGS: No fracture. The elbow joint is normally spaced and aligned. No joint effusion. Subcutaneous soft tissue edema is noted medially and posteriorly. No radiopaque foreign body.  IMPRESSION: No fracture or bone lesion.  No elbow joint abnormality.   Electronically Signed   By: Amie Portlandavid  Ormond M.D.   On: 08/23/2013 14:05    Scheduled Meds: . amiodarone  200 mg Oral Daily  . clopidogrel  75 mg Oral Daily  . furosemide  40 mg Oral BID  . heparin  5,000 Units Subcutaneous 3 times per day  . insulin aspart  0-15 Units Subcutaneous TID WC  . insulin aspart  0-5 Units Subcutaneous QHS  . insulin glargine  65 Units Subcutaneous QHS  . piperacillin-tazobactam (ZOSYN)  IV  3.375 g Intravenous Q8H  . potassium chloride  40 mEq Oral Daily  . ramipril  20 mg Oral Daily  . simvastatin  40 mg Oral q1800  . vancomycin  1,500 mg Intravenous Q24H   Continuous Infusions: . sodium chloride 10 mL/hr at 08/23/13 2355    Active Problems:   Cellulitis    Time spent: 25 minutes    [facility],Skylor Hughson S  [facility] Pager [phone]. If 7PM-7AM, please contact night-coverage at [email or web address], password [email or web address] 08/24/2013, 10:45 AM  LOS: 1 day            Him him

## 2013-08-25 DIAGNOSIS — R0602 Shortness of breath: Secondary | ICD-10-CM

## 2013-08-25 LAB — BASIC METABOLIC PANEL
ANION GAP: 11 (ref 5–15)
BUN: 26 mg/dL — AB (ref 6–23)
CHLORIDE: 88 meq/L — AB (ref 96–112)
CO2: 38 mEq/L — ABNORMAL HIGH (ref 19–32)
CREATININE: 1.57 mg/dL — AB (ref 0.50–1.35)
Calcium: 9.7 mg/dL (ref 8.4–10.5)
GFR calc Af Amer: 48 mL/min — ABNORMAL LOW (ref >90)
GFR, EST NON AFRICAN AMERICAN: 41 mL/min — AB (ref >90)
Glucose, Bld: 233 mg/dL — ABNORMAL HIGH (ref 70–99)
POTASSIUM: 3.9 meq/L (ref 3.7–5.3)
Sodium: 137 mEq/L (ref 137–147)

## 2013-08-25 LAB — GLUCOSE, CAPILLARY
GLUCOSE-CAPILLARY: 217 mg/dL — AB (ref 70–99)
GLUCOSE-CAPILLARY: 134 mg/dL — AB (ref 70–99)
Glucose-Capillary: 278 mg/dL — ABNORMAL HIGH (ref 70–99)
Glucose-Capillary: 217 mg/dL — ABNORMAL HIGH (ref 70–99)
Glucose-Capillary: 316 mg/dL — ABNORMAL HIGH (ref 70–99)

## 2013-08-25 LAB — CBC
HCT: 43.8 % (ref 39.0–52.0)
HEMOGLOBIN: 14.6 g/dL (ref 13.0–17.0)
MCH: 29.3 pg (ref 26.0–34.0)
MCHC: 33.3 g/dL (ref 30.0–36.0)
MCV: 88 fL (ref 78.0–100.0)
PLATELETS: 174 10*3/uL (ref 150–400)
RBC: 4.98 MIL/uL (ref 4.22–5.81)
RDW: 15.5 % (ref 11.5–15.5)
WBC: 11.4 10*3/uL — ABNORMAL HIGH (ref 4.0–10.5)

## 2013-08-25 MED ORDER — INSULIN ASPART 100 UNIT/ML ~~LOC~~ SOLN
3.0000 [IU] | Freq: Three times a day (TID) | SUBCUTANEOUS | Status: DC
  Administered 2013-08-25 – 2013-08-27 (×6): 3 [IU] via SUBCUTANEOUS

## 2013-08-25 MED ORDER — INSULIN GLARGINE 100 UNIT/ML ~~LOC~~ SOLN
70.0000 [IU] | Freq: Every day | SUBCUTANEOUS | Status: DC
  Administered 2013-08-25 – 2013-08-26 (×2): 70 [IU] via SUBCUTANEOUS
  Filled 2013-08-25 (×3): qty 0.7

## 2013-08-25 MED ORDER — INSULIN ASPART 100 UNIT/ML ~~LOC~~ SOLN: 0.0000 [IU] | Freq: Three times a day (TID) | SUBCUTANEOUS | Status: DC

## 2013-08-25 NOTE — Consult Note (Signed)
Reason for Consult: Right elbow cellulitis Referring Physician: Dr. [name] is an 79 y.o. male.  HPI: 79 year old male developed pain and swelling over his right elbow after scratching himself secondary to itching from a mosquito bite. He was treated with oral antibiotics at [facility] and did not improve. He came to the hospital was admitted with acute exacerbation of congestive heart failure as well as infected right elbow bursitis/cellulitis. He was initially started on Zosyn without good response and then placed on vancomycin with improvement. X-rays negative including no joint effusion. Glucose did run high he did have a fever while at home. He has some pain in terms of elbow flexion right over the olecranon bursa.  Past Medical History  Diagnosis Date  . CAD (coronary artery disease)     ant MI 10. CABG 2001 [place] D1 SVG LAD SVG om SVG PDA EF 30%  . CHF (congestive heart failure)   . Diabetes mellitus     mellitus? lantus at night  . Hyperlipidemia     Past Surgical History  Procedure Laterality Date  . Coronary artery bypass graft    . Cardiac catheterization      History reviewed. No pertinent family history.  Social History:  reports that he quit smoking about 43 years ago. His smoking use included Cigarettes. He smoked 0.00 packs per day. He has never used smokeless tobacco. He reports that he does not drink alcohol or use illicit drugs.  Allergies:  Allergies  Allergen Reactions  . Cephalexin   . Promethazine Hcl     Medications: I have reviewed the patient's current medications.  Results for orders placed during the hospital encounter of 08/23/13 (from the past 48 hour(s))  GLUCOSE, CAPILLARY     Status: Abnormal   Collection Time    08/23/13 10:14 PM      Result Value Ref Range   Glucose-Capillary 279 (*) 70 - 99 mg/dL   Comment 1 Notify RN    BASIC METABOLIC PANEL     Status: Abnormal   Collection Time    08/24/13  5:55 AM   Result Value Ref Range   Sodium 133 (*) 137 - 147 mEq/L   Potassium 3.3 (*) 3.7 - 5.3 mEq/L   Chloride 88 (*) 96 - 112 mEq/L   CO2 29  19 - 32 mEq/L   Glucose, Bld 247 (*) 70 - 99 mg/dL   BUN 27 (*) 6 - 23 mg/dL   Creatinine, Ser 1.46 (*) 0.50 - 1.35 mg/dL   Calcium 9.0  8.4 - 10.5 mg/dL   GFR calc non Af Amer 45 (*) >90 mL/min   GFR calc Af Amer 52 (*) >90 mL/min   Comment: (NOTE)     The eGFR has been calculated using the CKD EPI equation.     This calculation has not been validated in all clinical situations.     eGFR's persistently <90 mL/min signify possible Chronic Kidney     Disease.   Anion gap 16 (*) 5 - 15  GLUCOSE, CAPILLARY     Status: Abnormal   Collection Time    08/24/13  8:17 AM      Result Value Ref Range   Glucose-Capillary 230 (*) 70 - 99 mg/dL   Comment 1 Notify RN     Comment 2 Documented in Chart    GLUCOSE, CAPILLARY     Status: Abnormal   Collection Time    08/24/13 11:25 AM  Result Value Ref Range   Glucose-Capillary 269 (*) 70 - 99 mg/dL   Comment 1 Notify RN    GLUCOSE, CAPILLARY     Status: Abnormal   Collection Time    08/24/13  4:57 PM      Result Value Ref Range   Glucose-Capillary 236 (*) 70 - 99 mg/dL   Comment 1 Notify RN    GLUCOSE, CAPILLARY     Status: Abnormal   Collection Time    08/24/13 10:43 PM      Result Value Ref Range   Glucose-Capillary 278 (*) 70 - 99 mg/dL   Comment 1 Notify RN     Comment 2 Documented in Chart    BASIC METABOLIC PANEL     Status: Abnormal   Collection Time    08/25/13  6:09 AM      Result Value Ref Range   Sodium 137  137 - 147 mEq/L   Potassium 3.9  3.7 - 5.3 mEq/L   Chloride 88 (*) 96 - 112 mEq/L   CO2 38 (*) 19 - 32 mEq/L   Glucose, Bld 233 (*) 70 - 99 mg/dL   BUN 26 (*) 6 - 23 mg/dL   Creatinine, Ser 1.57 (*) 0.50 - 1.35 mg/dL   Calcium 9.7  8.4 - 10.5 mg/dL   GFR calc non Af Amer 41 (*) >90 mL/min   GFR calc Af Amer 48 (*) >90 mL/min   Comment: (NOTE)     The eGFR has been calculated  using the CKD EPI equation.     This calculation has not been validated in all clinical situations.     eGFR's persistently <90 mL/min signify possible Chronic Kidney     Disease.   Anion gap 11  5 - 15  CBC     Status: Abnormal   Collection Time    08/25/13  6:09 AM      Result Value Ref Range   WBC 11.4 (*) 4.0 - 10.5 K/uL   RBC 4.98  4.22 - 5.81 MIL/uL   Hemoglobin 14.6  13.0 - 17.0 g/dL   HCT 43.8  39.0 - 52.0 %   MCV 88.0  78.0 - 100.0 fL   MCH 29.3  26.0 - 34.0 pg   MCHC 33.3  30.0 - 36.0 g/dL   RDW 15.5  11.5 - 15.5 %   Platelets 174  150 - 400 K/uL   Comment: DELTA CHECK NOTED  GLUCOSE, CAPILLARY     Status: Abnormal   Collection Time    08/25/13  8:06 AM      Result Value Ref Range   Glucose-Capillary 217 (*) 70 - 99 mg/dL   Comment 1 Notify RN    GLUCOSE, CAPILLARY     Status: Abnormal   Collection Time    08/25/13 12:27 PM      Result Value Ref Range   Glucose-Capillary 217 (*) 70 - 99 mg/dL   Comment 1 Notify RN    GLUCOSE, CAPILLARY     Status: Abnormal   Collection Time    08/25/13  4:48 PM      Result Value Ref Range   Glucose-Capillary 316 (*) 70 - 99 mg/dL    No results found.  ROS Blood pressure 120/50, pulse 66, temperature 98.4 F (36.9 C), temperature source Oral, resp. rate 18, height 6' (1.829 m), weight 217 lb 2.5 oz (98.5 kg), SpO2 94.00%. Physical Exam  General the patient is well-developed and well-nourished grooming and hygiene are  normal Oriented x3 Mood and affect normal Ambulation we did not assess ambulation as the patient has been lying in bed for most of the day Inspection of the right arm and elbow demarcations of the previous redness and cellulitis are marked with much improvement he has an isolated area over the distal area of the upper brachium near the elbow which is still red. There is soft tissue swelling without fluctuance or induration. He has flexion to 90 and then has pain over the olecranon. Olecranon bursa has decreased  in size according to the family and the patient the elbow joint is stable Motor exam is normal Skin redness over the distal brachium anteriorly near the cubital fossa with abrasion from scratching over the olecranon  Cardiovascular radial artery pulse normal in Sensory exam normal  Assessment/Plan: X-ray no fracture or dislocation no elbow joint effusion  Impression cellulitis and elbow bursitis  Continue vancomycin  Insert PICC line  Anticipate 5-7 days of IV vancomycin.  Arther Abbott 08/25/2013, 5:30 PM

## 2013-08-25 NOTE — Progress Notes (Signed)
Patient ID: SHAURYA HARIRI, male   DOB: [birth year], 79 y.o.   MRN: [id number]  Diabetes and heart failure history of heart disease on Plavix presents with swelling of his elbow and cellulitis/bursitis with infection initially started on Zosyn switched to vancomycin this appears to be day 2 with improvement according to demarcation on his arm. I see no evidence of joint effusion on x-ray or clinical exam. He does have an area of redness without fluctuance on the anterior aspect of the lower brachium of the arm. Recommend continue vancomycin I would expect five-day course initially to get improvement and may need to go 7-14 days.  Please note PICC line was ordered today by me in case we have to go longer.  Also note if he is on Plavix and he did have to have an incision and drainage then we are looking at a 48 hour window off of Plavix before we could do surgery. He is on heparin he does not have a stent solid recommend stopping the Plavix continuing the heparin and continuing with IV vancomycin

## 2013-08-25 NOTE — Progress Notes (Signed)
UR chart review completed.  

## 2013-08-25 NOTE — Progress Notes (Signed)
[facility] PROGRESS NOTE  Phillip HeraldJohn W Zuch GEX:[id number]:[id number] DOB: 1934-09-30 DOA: 08/23/2013 PCP: Kirk RuthsMCGOUGH,WILLIAM M, MD  Assessment/Plan: 1. Right arm cellulitis- continue with vancomycin, will discontinue Zosyn as not much role of Zosyn in cellulitis caused by diabetes mellitus. Same microorganisms have been isolated in both diabetic nondiabetic cellulitis/abscess patients  in recent studies in medical literature. (Comparison of the Microbiology and Antibiotic Treatment Among Diabetic and Nondiabetic Patients Hospitalized for Cellulitis or Cutaneous Abscess. Huey RomansJenkins TC, [facility], et al: [facility]; 0102;72014;9 (December): [phone].) X-ray of the elbow did not show fracture or bone lesion. Will consult orthopedics for possible drainage if developing abscess. 2. CHF exacerbation-  Resolved, patient came in CHF exacerbation, diurese very well with IV Lasix. At this time I will switch over to by mouth Lasix.  3. Hypokalemia- secondary to diuresis, replaced. 4. Atrial fibrillation- continue him in the long, Plavix 5. Coronary artery disease- continue Plavix, statin, Coreg. 6. Acute on chronic kidney disease- renal function has improved and is almost back to baseline of 1.46 after diuresis. With probable improved blood flow to kidneys. 7. Diabetes mellitus- will increase the dose of Lantus to 70 units, at bedtime, sliding scale insulin with NovoLog. Will start the meal coverage 3 units tid. 8. DVT prophylaxis-Lovenox   Code Status:  Full code Family Communication: *Discussed with daughter at bedside Disposition Plan: *Home when stable   Consultants:  None  Procedures:  None  Antibiotics:  Vancomycin 7/18  Zosyn 7/18  HPI/Subjective: 79 year old male came with right arm cellulitis, CHF exacerbation. Started on IV antibiotics and IV Lasix. Feels better this morning, no shortness of breath pain and swelling of the arm has reduced.   Objective: Filed Vitals:   08/25/13 0630  BP: 120/43   Pulse: 64  Temp: 98.7 F (37.1 C)  Resp: 18    Intake/Output Summary (Last 24 hours) at 08/25/13 1007 Last data filed at 08/25/13 0650  Gross per 24 hour  Intake 789.17 ml  Output   1650 ml  Net -860.83 ml   Filed Weights   08/23/13 1652 08/24/13 0534 08/25/13 0630  Weight: 101.152 kg (223 lb) 100.4 kg (221 lb 5.5 oz) 98.5 kg (217 lb 2.5 oz)    Exam:  Physical Exam: Head: Normocephalic, atraumatic.  Lungs: Normal respiratory effort. B/L Clear to auscultation, no crackles or wheezes.  Heart: Regular RR. S1 and S2 normal  Abdomen: BS normoactive. Soft, Nondistended, non-tender.  Extremities: Right upper extremity, erythematous, warm to touch, edema noted in the right forearm, mild erythema  Data Reviewed: Basic Metabolic Panel:  Recent Labs Lab 08/23/13 1345 08/24/13 0555 08/25/13 0609  NA 136* 133* 137  K 3.7 3.3* 3.9  CL 93* 88* 88*  CO2 30 29 38*  GLUCOSE 220* 247* 233*  BUN 24* 27* 26*  CREATININE 1.68* 1.46* 1.57*  CALCIUM 9.2 9.0 9.7   Liver Function Tests: No results found for this basename: AST, ALT, ALKPHOS, BILITOT, PROT, ALBUMIN,  in the last 168 hours No results found for this basename: LIPASE, AMYLASE,  in the last 168 hours No results found for this basename: AMMONIA,  in the last 168 hours CBC:  Recent Labs Lab 08/23/13 1345 08/25/13 0609  WBC 11.4* 11.4*  NEUTROABS 9.6*  --   HGB 12.6* 14.6  HCT 37.6* 43.8  MCV 89.5 88.0  PLT 126* 174   Cardiac Enzymes: No results found for this basename: CKTOTAL, CKMB, CKMBINDEX, TROPONINI,  in the last 168 hours BNP (last 3 results)  Recent  Labs  08/23/13 1345  PROBNP 2796.0*   CBG:  Recent Labs Lab 08/24/13 0817 08/24/13 1125 08/24/13 1657 08/24/13 2243 08/25/13 0806  GLUCAP 230* 269* 236* 278* 217*    No results found for this or any previous visit (from the past 240 hour(s)).   Studies: Dg Chest 2 View  08/23/2013   CLINICAL DATA:  Shortness of breath. Possible right arm  cellulitis. Prior CABG.  EXAM: CHEST  2 VIEW  COMPARISON:  Two-view chest x-ray 03/14/2012, 07/08/2010.  FINDINGS: Lateral image blurred by patient motion. Prior sternotomy for CABG. Cardiac silhouette moderately enlarged but stable. Chronic pulmonary venous hypertension without overt edema currently. Chronic blunting of the left costophrenic angle. No confluent airspace consolidation. No pneumothorax. Visualized bony thorax intact.  IMPRESSION: No acute cardiopulmonary disease. Stable moderate cardiomegaly and stable chronic pulmonary venous hypertension without overt edema. Stable chronic pleuroparenchymal scarring at the left base.   Electronically Signed   By: Hulan Saas M.D.   On: 08/23/2013 14:06   Dg Elbow Complete Right  08/23/2013   CLINICAL DATA:  Right arm pain, redness and swelling.  EXAM: RIGHT ELBOW - COMPLETE 3+ VIEW  COMPARISON:  None.  FINDINGS: No fracture. The elbow joint is normally spaced and aligned. No joint effusion. Subcutaneous soft tissue edema is noted medially and posteriorly. No radiopaque foreign body.  IMPRESSION: No fracture or bone lesion.  No elbow joint abnormality.   Electronically Signed   By: Amie Portland M.D.   On: 08/23/2013 14:05    Scheduled Meds: . amiodarone  200 mg Oral Daily  . atorvastatin  20 mg Oral q1800  . clopidogrel  75 mg Oral Daily  . furosemide  40 mg Oral BID  . heparin  5,000 Units Subcutaneous 3 times per day  . insulin aspart  0-15 Units Subcutaneous TID WC  . insulin aspart  0-5 Units Subcutaneous QHS  . insulin glargine  65 Units Subcutaneous QHS  . potassium chloride  40 mEq Oral Daily  . ramipril  20 mg Oral Daily  . vancomycin  1,500 mg Intravenous Q24H   Continuous Infusions: . sodium chloride 10 mL/hr at 08/24/13 1849    Active Problems:   DIABETES MELLITUS   CAD   CHF   A-fib   Cellulitis   AKI (acute kidney injury)    Time spent: 25 minutes    [facility]  [facility] Pager [phone]. If  7PM-7AM, please contact night-coverage at [email or web address], password [facility] 08/25/2013, 10:07 AM  LOS: 2 days            Him him

## 2013-08-25 NOTE — Progress Notes (Signed)
Inpatient Diabetes Program Recommendations  AACE/ADA: New Consensus Statement on Inpatient Glycemic Control (2013)  Target Ranges:  Prepandial:   less than 140 mg/dL      Peak postprandial:   less than 180 mg/dL (1-2 hours)      Critically ill patients:  140 - 180 mg/dL   Results for Phillip Henderson, Phillip Henderson (MRN [id number]) as of 08/25/2013 07:36  Ref. Range 08/24/2013 08:17 08/24/2013 11:25 08/24/2013 16:57 08/24/2013 22:43  Glucose-Capillary Latest Range: 70-99 mg/dL 242 (H) 353 (H) 614 (H) 278 (H)   Results for Phillip Henderson, Phillip Henderson (MRN [id number]) as of 08/25/2013 07:36  Ref. Range 08/23/2013 13:45 08/24/2013 05:55 08/25/2013 06:09  Hemoglobin A1C Latest Range: <5.7 % 9.4 (H)    Glucose Latest Range: 70-99 mg/dL  761 (H) 950 (H)   Diabetes history: DM2 Outpatient Diabetes medications: Lantus 65-70 units QHS Current orders for Inpatient glycemic control: Lantus 65 units QHS, Novolog 0-15 units AC, Novolog 0-5 units HS  Inpatient Diabetes Program Recommendations Insulin - Basal: Please consider increasing Lantus ot 70 units QHS. Correction (SSI): Please consider increasing Novolog correction to resistant scale. Insulin - Meal Coverage: Please consider ordering Novolog 5 units TID with meals for meal coverage (in addition to correction).  Thanks, Orlando Penner, RN, MSN, CCRN Diabetes Coordinator Inpatient Diabetes Program [phone] (Team Pager) [phone] ([facility] office) [phone] [facility] office)

## 2013-08-25 NOTE — Progress Notes (Signed)
ANTIBIOTIC CONSULT NOTE - follow up  Pharmacy Consult for Vancomycin Indication: cellulitis  Allergies  Allergen Reactions  . Cephalexin   . Promethazine Hcl    Patient Measurements: Height: 6' (182.9 cm) Weight: 217 lb 2.5 oz (98.5 kg) IBW/kg (Calculated) : 77.6  Vital Signs: Temp: 98.7 F (37.1 C) (07/20 0630) Temp src: Oral (07/20 0630) BP: 120/43 mmHg (07/20 0630) Pulse Rate: 64 (07/20 0630) Intake/Output from previous day: 07/19 0701 - 07/20 0700 In: 1029.2 [P.O.:720; I.V.:309.2] Out: 2350 [Urine:2350] Intake/Output from this shift:    Labs:  Recent Labs  08/23/13 1345 08/24/13 0555 08/25/13 0609  WBC 11.4*  --  11.4*  HGB 12.6*  --  14.6  PLT 126*  --  174  CREATININE 1.68* 1.46* 1.57*   Estimated Creatinine Clearance: 48.7 ml/min (by C-G formula based on Cr of 1.57). No results found for this basename: VANCOTROUGH, VANCOPEAK, VANCORANDOM, GENTTROUGH, GENTPEAK, GENTRANDOM, TOBRATROUGH, TOBRAPEAK, TOBRARND, AMIKACINPEAK, AMIKACINTROU, AMIKACIN,  in the last 72 hours   Microbiology: No results found for this or any previous visit (from the past 720 hour(s)).  Medical History: Past Medical History  Diagnosis Date  . CAD (coronary artery disease)     ant MI 69. CABG 2001 [place] D1 SVG LAD SVG om SVG PDA EF 30%  . CHF (congestive heart failure)   . Diabetes mellitus     mellitus? lantus at night  . Hyperlipidemia    Medications:  Scheduled:  . amiodarone  200 mg Oral Daily  . atorvastatin  20 mg Oral q1800  . clopidogrel  75 mg Oral Daily  . furosemide  40 mg Oral BID  . heparin  5,000 Units Subcutaneous 3 times per day  . insulin aspart  0-15 Units Subcutaneous TID WC  . insulin aspart  0-5 Units Subcutaneous QHS  . insulin glargine  65 Units Subcutaneous QHS  . potassium chloride  40 mEq Oral Daily  . ramipril  20 mg Oral Daily  . vancomycin  1,500 mg Intravenous Q24H   Assessment: 79 yo diabetic M with R arm cellulitis that has worsened on  Augmentin.  WBC mildly elevated.   Acute on chronic renal failure noted.  Normalized CrCl ~ 35-40 ml/min.  Zosyn d/c'd per MD.  Vancomycin single agent.   Vancomycin 7/18>> Zosyn 7/18>>7/19  Goal of Therapy:  Vancomycin trough level 10-15 mcg/ml  Plan:  Vancomycin 1500mg  IV q24h Check Vancomycin trough at steady state Monitor renal function and cx data   Valrie Hart A 08/25/2013,9:10 AM

## 2013-08-26 ENCOUNTER — Inpatient Hospital Stay (HOSPITAL_COMMUNITY): Payer: Medicare Other

## 2013-08-26 ENCOUNTER — Encounter (HOSPITAL_COMMUNITY): Payer: Medicare Other

## 2013-08-26 LAB — BASIC METABOLIC PANEL
ANION GAP: 12 (ref 5–15)
BUN: 27 mg/dL — ABNORMAL HIGH (ref 6–23)
CHLORIDE: 90 meq/L — AB (ref 96–112)
CO2: 36 mEq/L — ABNORMAL HIGH (ref 19–32)
Calcium: 9.4 mg/dL (ref 8.4–10.5)
Creatinine, Ser: 1.5 mg/dL — ABNORMAL HIGH (ref 0.50–1.35)
GFR, EST AFRICAN AMERICAN: 50 mL/min — AB (ref >90)
GFR, EST NON AFRICAN AMERICAN: 43 mL/min — AB (ref >90)
Glucose, Bld: 110 mg/dL — ABNORMAL HIGH (ref 70–99)
Potassium: 3.1 mEq/L — ABNORMAL LOW (ref 3.7–5.3)
SODIUM: 138 meq/L (ref 137–147)

## 2013-08-26 LAB — GLUCOSE, CAPILLARY
GLUCOSE-CAPILLARY: 141 mg/dL — AB (ref 70–99)
GLUCOSE-CAPILLARY: 157 mg/dL — AB (ref 70–99)
GLUCOSE-CAPILLARY: 153 mg/dL — AB (ref 70–99)
Glucose-Capillary: 195 mg/dL — ABNORMAL HIGH (ref 70–99)

## 2013-08-26 MED ORDER — SODIUM CHLORIDE 0.9 % IJ SOLN
10.0000 mL | Freq: Two times a day (BID) | INTRAMUSCULAR | Status: DC
  Administered 2013-08-26 – 2013-08-27 (×3): 10 mL

## 2013-08-26 MED ORDER — SODIUM CHLORIDE 0.9 % IJ SOLN
10.0000 mL | INTRAMUSCULAR | Status: DC | PRN
  Administered 2013-08-26: 20 mL

## 2013-08-26 MED ORDER — POTASSIUM CHLORIDE CRYS ER 20 MEQ PO TBCR
40.0000 meq | EXTENDED_RELEASE_TABLET | Freq: Two times a day (BID) | ORAL | Status: DC
  Administered 2013-08-26 – 2013-08-27 (×3): 40 meq via ORAL
  Filled 2013-08-26 (×4): qty 2

## 2013-08-26 NOTE — Progress Notes (Signed)
[facility] PROGRESS NOTE  Phillip Henderson:[id number] DOB: [birth year] DOA: 08/23/2013 PCP: Phillip Ruths, MD  Assessment/Plan: 1. Right arm cellulitis/elbow abscess- continue with vancomycin, will discontinue Zosyn as not much role of Zosyn in cellulitis caused by diabetes mellitus. Same microorganisms have been isolated in both diabetic nondiabetic cellulitis/abscess patients  in recent studies in medical literature. (Comparison of the Microbiology and Antibiotic Treatment Among Diabetic and Nondiabetic Patients Hospitalized for Cellulitis or Cutaneous Abscess. Phillip Henderson, [facility] Henderson, et al: [facility]; 0712;1 (December): [phone].) X-ray of the elbow did not show fracture or bone lesion. Consulted  orthopedics for possible drainage if developing abscess. Ortho recommends to continue antibiotics for now, and hold the plavix in case he needs I&D. Will hold the plavix. Picc line inserted for 7-14 days of therapy. 2. CHF exacerbation-  Resolved, patient came in CHF exacerbation, diurese very well with IV Lasix. At this time I will switch over to by mouth Lasix.  3. Hypokalemia- secondary to diuresis, will replace the potassium and check bmp in am. 4. Atrial fibrillation- continue him in the long, Plavix on hold for possible surgery 5. Coronary artery disease- continue  statin, Coreg. 6. Acute on chronic kidney disease- renal function has improved and is almost back to baseline of 1.46 after diuresis. With probable improved blood flow to kidneys. 7. Diabetes mellitus-Blood glucose is better controlled,  increased the dose of Lantus to 70 units, at bedtime, sliding scale insulin with NovoLog.  started the meal coverage 3 units tid. 8. DVT prophylaxis-Lovenox   Code Status:  Full code Family Communication: *Discussed with daughter at bedside Disposition Plan: *Home when stable   Consultants:  None  Procedures:  None  Antibiotics:  Vancomycin 7/18>>  Zosyn 7/18-  7/19  HPI/Subjective: 79 year old male came with right arm cellulitis, CHF exacerbation. Started on IV antibiotics and IV Lasix.  Now on Po lasix, right arm swelling and pain has improved. Was seen by ortho yesterday.  Objective: Filed Vitals:   08/26/13 0532  BP: 119/64  Pulse: 69  Temp: 98.3 F (36.8 C)  Resp: 18    Intake/Output Summary (Last 24 hours) at 08/26/13 1410 Last data filed at 08/26/13 1130  Gross per 24 hour  Intake 241.67 ml  Output   1150 ml  Net -908.33 ml   Filed Weights   08/24/13 0534 08/25/13 0630 08/26/13 0532  Weight: 100.4 kg (221 lb 5.5 oz) 98.5 kg (217 lb 2.5 oz) 98.2 kg (216 lb 7.9 oz)    Exam:  Physical Exam: Head: Normocephalic, atraumatic.  Lungs: Normal respiratory effort. B/L Clear to auscultation, no crackles or wheezes.  Heart: Regular RR. S1 and S2 normal  Abdomen: BS normoactive. Soft, Nondistended, non-tender.  Extremities: Right upper extremity, erythematous, warm to touch, edema noted in the right forearm, mild erythema  Data Reviewed: Basic Metabolic Panel:  Recent Labs Lab 08/23/13 1345 08/24/13 0555 08/25/13 0609 08/26/13 0620  NA 136* 133* 137 138  K 3.7 3.3* 3.9 3.1*  CL 93* 88* 88* 90*  CO2 30 29 38* 36*  GLUCOSE 220* 247* 233* 110*  BUN 24* 27* 26* 27*  CREATININE 1.68* 1.46* 1.57* 1.50*  CALCIUM 9.2 9.0 9.7 9.4   Liver Function Tests: No results found for this basename: AST, ALT, ALKPHOS, BILITOT, PROT, ALBUMIN,  in the last 168 hours No results found for this basename: LIPASE, AMYLASE,  in the last 168 hours No results found for this basename: AMMONIA,  in the last 168 hours CBC:  Recent Labs Lab 08/23/13 1345 08/25/13 0609  WBC 11.4* 11.4*  NEUTROABS 9.6*  --   HGB 12.6* 14.6  HCT 37.6* 43.8  MCV 89.5 88.0  PLT 126* 174   Cardiac Enzymes: No results found for this basename: CKTOTAL, CKMB, CKMBINDEX, TROPONINI,  in the last 168 hours BNP (last 3 results)  Recent Labs  08/23/13 1345  PROBNP  2796.0*   CBG:  Recent Labs Lab 08/25/13 1227 08/25/13 1648 08/25/13 2011 08/26/13 0914 08/26/13 1216  GLUCAP 217* 316* 134* 141* 157*    No results found for this or any previous visit (from the past 240 hour(Henderson)).   Studies: Dg Chest Port 1 View  08/26/2013   CLINICAL DATA:  PICC line placement  EXAM: PORTABLE CHEST - 1 VIEW  COMPARISON:  08/23/2013  FINDINGS: Left-sided PICC line with the tip projecting over the SVC. Mild bilateral interstitial thickening and prominence of the central pulmonary vasculature. No pleural effusion, pneumothorax or focal consolidation. Stable cardiomegaly. Prior CABG. Thoracic aortic atherosclerosis  Unremarkable osseous structures.  IMPRESSION: Left-sided PICC line with the tip projecting over the SVC.   Electronically Signed   By: Phillip KoHetal  Henderson   On: 08/26/2013 11:50    Scheduled Meds: . amiodarone  200 mg Oral Daily  . atorvastatin  20 mg Oral q1800  . furosemide  40 mg Oral BID  . heparin  5,000 Units Subcutaneous 3 times per day  . insulin aspart  0-15 Units Subcutaneous TID WC  . insulin aspart  0-5 Units Subcutaneous QHS  . insulin aspart  3 Units Subcutaneous TID WC  . insulin glargine  70 Units Subcutaneous QHS  . potassium chloride  40 mEq Oral BID  . ramipril  20 mg Oral Daily  . sodium chloride  10-40 mL Intracatheter Q12H  . vancomycin  1,500 mg Intravenous Q24H   Continuous Infusions: . sodium chloride 10 mL/hr at 08/26/13 0500    Active Problems:   DIABETES MELLITUS   CAD   CHF   A-fib   Cellulitis   AKI (acute kidney injury)    Time spent: 25 minutes    [facility],Phillip Henderson  [facility] Pager [phone]. If 7PM-7AM, please contact night-coverage at [email or web address], password [email or web address] 08/26/2013, 2:10 PM  LOS: 3 days            Him him

## 2013-08-26 NOTE — Care Management Note (Addendum)
    Page 1 of 2   08/27/2013     1:48:36 PM CARE MANAGEMENT NOTE 08/27/2013  Patient:  Phillip Henderson, Phillip Henderson   Account Number:  [id number]  Date Initiated:  08/26/2013  Documentation initiated by:  Sharrie Rothman  Subjective/Objective Assessment:   Pt admitted from home with cellulitis. Pt lives with his granddaughter and will return home at discharge. Pt is fairly independent with ADL's     Action/Plan:   Pt will need 1-2 weeks of IV AB. Pt would like to do this at home with [facility] RN (per pts choice). Spoke with pts granddaughter, Phillip Henderson, who stated she would be the one to learn and is comfortable to do this.   Anticipated DC Date:  08/28/2013   Anticipated DC Plan:  HOME W HOME HEALTH SERVICES      DC Planning Services  CM consult      [facility] Choice  HOME HEALTH   Choice offered to / List presented to:  C-1 Patient        HH arranged  HH-1 RN  IV Antibiotics      HH agency  [facility]   Status of service:  Completed, signed off Medicare Important Message given?  YES (If response is "NO", the following Medicare IM given date fields will be blank) Date Medicare IM given:  08/27/2013 Medicare IM given by:  Sharrie Rothman Date Additional Medicare IM given:   Additional Medicare IM given by:    Discharge Disposition:  HOME W HOME HEALTH SERVICES  Per UR Regulation:    If discussed at Long Length of Stay Meetings, dates discussed:    Comments:  08/27/13 1345 Arlyss Queen, RN BSN CM Pt discharged home today with [facility] RN for IV AB (per pts choice). Alroy Bailiff of [facility] is aware and will collect pts information from the chart. HH services to start within 24 hours of discharge. No DME needs noted. Pt and pts nurse aware of discharge arrangements. 08/26/13 1550 Arlyss Queen, RN BSN CM Alroy Bailiff  of [facility] is aware of referral and will collect the pts information from the chart.

## 2013-08-27 DIAGNOSIS — E7800 Pure hypercholesterolemia, unspecified: Secondary | ICD-10-CM

## 2013-08-27 DIAGNOSIS — Z7901 Long term (current) use of anticoagulants: Secondary | ICD-10-CM

## 2013-08-27 DIAGNOSIS — R609 Edema, unspecified: Secondary | ICD-10-CM

## 2013-08-27 DIAGNOSIS — Z5181 Encounter for therapeutic drug level monitoring: Secondary | ICD-10-CM

## 2013-08-27 LAB — BASIC METABOLIC PANEL
ANION GAP: 12 (ref 5–15)
BUN: 34 mg/dL — AB (ref 6–23)
CALCIUM: 9.3 mg/dL (ref 8.4–10.5)
CO2: 35 mEq/L — ABNORMAL HIGH (ref 19–32)
CREATININE: 1.66 mg/dL — AB (ref 0.50–1.35)
Chloride: 92 mEq/L — ABNORMAL LOW (ref 96–112)
GFR, EST AFRICAN AMERICAN: 45 mL/min — AB (ref >90)
GFR, EST NON AFRICAN AMERICAN: 38 mL/min — AB (ref >90)
Glucose, Bld: 155 mg/dL — ABNORMAL HIGH (ref 70–99)
Potassium: 3.6 mEq/L — ABNORMAL LOW (ref 3.7–5.3)
Sodium: 139 mEq/L (ref 137–147)

## 2013-08-27 LAB — GLUCOSE, CAPILLARY
GLUCOSE-CAPILLARY: 184 mg/dL — AB (ref 70–99)
Glucose-Capillary: 139 mg/dL — ABNORMAL HIGH (ref 70–99)

## 2013-08-27 LAB — VANCOMYCIN, TROUGH: VANCOMYCIN TR: 18.8 ug/mL (ref 10.0–20.0)

## 2013-08-27 MED ORDER — VANCOMYCIN HCL IN DEXTROSE 1-5 GM/200ML-% IV SOLN
1000.0000 mg | INTRAVENOUS | Status: DC
  Filled 2013-08-27: qty 200

## 2013-08-27 MED ORDER — VANCOMYCIN HCL IN DEXTROSE 1-5 GM/200ML-% IV SOLN: 1000.0000 mg | INTRAVENOUS | Status: DC

## 2013-08-27 NOTE — Progress Notes (Signed)
Late entry: Pt d/c at 1529. PICC line is in place at time of d/c. Pt verbalizes understanding of d/c understanding of d/c instructions, medications, when to call MD, follow up appts, and pt belongings policy. Pt has no questions at this time. Pt d/c via wheelchair by NT, accompanied by his wife. HH is set up and will be following up with him. Sheryn Bison

## 2013-08-27 NOTE — Discharge Summary (Signed)
Physician Discharge Summary  Phillip Henderson:[id number] DOB: Jun 26, 1934 DOA: 08/23/2013  PCP: Kirk RuthsMCGOUGH,WILLIAM M, MD  Admit date: 08/23/2013 Discharge date: 08/27/2013  Time spent: 40 minutes  Recommendations for Outpatient Follow-up:  1. Followup with primary care physician within one week. 2. Continue vancomycin for 7 more days, stop date is 09/03/13. 3. Follow renal function closely as patient will be discharged on vancomycin, patient already on Lasix, metolazone and ramipril  Discharge Diagnoses:  Active Problems:   DIABETES MELLITUS   CAD   CHF   A-fib   Cellulitis   AKI (acute kidney injury)   Discharge Condition: Stable  Diet recommendation: Carbohydrate modified diet, heart healthy diet.  Filed Weights   08/25/13 0630 08/26/13 0532 08/27/13 0500  Weight: 98.5 kg (217 lb 2.5 oz) 98.2 kg (216 lb 7.9 oz) 99.3 kg (218 lb 14.7 oz)    History of present illness:  Phillip HeraldJohn W Gingrich is a 79 y.o. male presenting with R arm became itchy 3-4 days ago while being outside. Pt thinks he experienced bug bites. Arm started to swell and become painful 3 days ago. Went to PCP yesterday adn started on Augmentin.  Also complainin of progressive SOB adn LE swelling since viral URI 2 wks ago. Graduatl wt gain during this time. Took 2 doses of metolazone today w/ some improvement  Hospital Course:   Right arm cellulitis/elbow abscess Patient presented with right elbow redness and swelling. Patient does have cellulitis and elbow bursitis. Orthopedics consulted, recommended PICC line and vancomycin for 7-14 days. Patient already had 4 days of vancomycin, will do 7 more days with PICC line. Patient followup with primary care physician prior to PICC line removal to ensure complete resolution of cellulitis. If patient still has any type of cellulitis/bursitis continue vancomycin, refer to orthopedics as outpatient.  CHF exacerbation Patient initially admitted to the hospital because of acute  CHF, treated with IV diuresis. Resolved, patient came in CHF exacerbation, diurese very well with IV Lasix.  At the time of discharge switched back to home dose of oral Lasix and metolazone.  Hypokalemia- secondary to diuresis, will replace the potassium and check bmp in am.   Atrial fibrillation- continue him in the long, Plavix on hold for possible surgery   Coronary artery disease- continue statin, Coreg.   Acute on chronic kidney disease- renal function has improved and is almost back to baseline of 1.46 after diuresis. With probable improved blood flow to kidneys.   Diabetes mellitus-Blood glucose is better controlled, increased the dose of Lantus to 70 units, at bedtime, sliding scale insulin with NovoLog. started the meal coverage 3 units tid.    Procedures:  PICC line placement on 08/26/2013  Consultations:  Orthopedics  Discharge Exam: Filed Vitals:   08/27/13 0601  BP: 104/60  Pulse: 65  Temp: 98.3 F (36.8 C)  Resp: 18   General: Alert and awake, oriented x3, not in any acute distress. HEENT: anicteric sclera, pupils reactive to light and accommodation, EOMI CVS: S1-S2 clear, no murmur rubs or gallops Chest: clear to auscultation bilaterally, no wheezing, rales or rhonchi Abdomen: soft nontender, nondistended, normal bowel sounds, no organomegaly Extremities: no cyanosis, clubbing or edema noted bilaterally Neuro: Cranial nerves II-XII intact, no focal neurological deficits  Discharge Instructions You were cared for by a hospitalist during your hospital stay. If you have any questions about your discharge medications or the care you received while you were in the hospital after you are discharged, you can call the unit and  asked to speak with the hospitalist on call if the hospitalist that took care of you is not available. Once you are discharged, your primary care physician will handle any further medical issues. Please note that NO REFILLS for any discharge  medications will be authorized once you are discharged, as it is imperative that you return to your primary care physician (or establish a relationship with a primary care physician if you do not have one) for your aftercare needs so that they can reassess your need for medications and monitor your lab values.  Discharge Instructions   Diet - low sodium heart healthy    Complete by:  As directed      Increase activity slowly    Complete by:  As directed             Medication List    STOP taking these medications       amoxicillin-clavulanate 875-125 MG per tablet  Commonly known as:  AUGMENTIN      TAKE these medications       carvedilol 12.5 MG tablet  Commonly known as:  COREG  TAKE 1 TABLET BY MOUTH TWICE DAILY     clopidogrel 75 MG tablet  Commonly known as:  PLAVIX  TAKE 1 TABLET BY MOUTH DAILY     furosemide 40 MG tablet  Commonly known as:  LASIX  Take 40 mg by mouth as directed. Takes twice daily and alternates three times daily every other day.     LANTUS 100 UNIT/ML injection  Generic drug:  insulin glargine  Inject 65-70 Units into the skin at bedtime. Sliding scale based on blood sugar readings     metolazone 5 MG tablet  Commonly known as:  ZAROXOLYN  Take 2.5 mg by mouth 2 (two) times daily as needed (for fluid retention).     PACERONE 200 MG tablet  Generic drug:  amiodarone  TAKE ONE TABLET BY MOUTH EVERY DAY     potassium chloride 10 MEQ CR tablet  Commonly known as:  KLOR-CON  Take 10 mEq by mouth daily.     pravastatin 40 MG tablet  Commonly known as:  PRAVACHOL  Take 40 mg by mouth at bedtime.     ramipril 10 MG capsule  Commonly known as:  ALTACE  Take 20 mg by mouth daily.     vancomycin 1 GM/200ML Soln  Commonly known as:  VANCOCIN  Inject 200 mLs (1,000 mg total) into the vein daily.  Start taking on:  08/28/2013       Allergies  Allergen Reactions  . Cephalexin   . Promethazine Hcl        Follow-up Information   Follow  up with [facility].   Contact information:   [address] [place] [address] [phone]       Follow up with Kirk Ruths, MD In 1 week.   Specialty:  Family Medicine   Contact information:   [address] [place] [address] [phone]        The results of significant diagnostics from this hospitalization (including imaging, microbiology, ancillary and laboratory) are listed below for reference.    Significant Diagnostic Studies: Dg Chest 2 View  08/23/2013   CLINICAL DATA:  Shortness of breath. Possible right arm cellulitis. Prior CABG.  EXAM: CHEST  2 VIEW  COMPARISON:  Two-view chest x-ray 03/14/2012, 07/08/2010.  FINDINGS: Lateral image blurred by patient motion. Prior sternotomy for CABG. Cardiac silhouette moderately enlarged but stable.  Chronic pulmonary venous hypertension without overt edema currently. Chronic blunting of the left costophrenic angle. No confluent airspace consolidation. No pneumothorax. Visualized bony thorax intact.  IMPRESSION: No acute cardiopulmonary disease. Stable moderate cardiomegaly and stable chronic pulmonary venous hypertension without overt edema. Stable chronic pleuroparenchymal scarring at the left base.   Electronically Signed   By: Hulan Saas M.D.   On: 08/23/2013 14:06   Dg Elbow Complete Right  08/23/2013   CLINICAL DATA:  Right arm pain, redness and swelling.  EXAM: RIGHT ELBOW - COMPLETE 3+ VIEW  COMPARISON:  None.  FINDINGS: No fracture. The elbow joint is normally spaced and aligned. No joint effusion. Subcutaneous soft tissue edema is noted medially and posteriorly. No radiopaque foreign body.  IMPRESSION: No fracture or bone lesion.  No elbow joint abnormality.   Electronically Signed   By: Amie Portland M.D.   On: 08/23/2013 14:05   Dg Chest Port 1 View  08/26/2013   CLINICAL DATA:  PICC line placement  EXAM: PORTABLE CHEST - 1 VIEW  COMPARISON:  08/23/2013  FINDINGS: Left-sided PICC  line with the tip projecting over the SVC. Mild bilateral interstitial thickening and prominence of the central pulmonary vasculature. No pleural effusion, pneumothorax or focal consolidation. Stable cardiomegaly. Prior CABG. Thoracic aortic atherosclerosis  Unremarkable osseous structures.  IMPRESSION: Left-sided PICC line with the tip projecting over the SVC.   Electronically Signed   By: Elige Ko   On: 08/26/2013 11:50    Microbiology: No results found for this or any previous visit (from the past 240 hour(s)).   Labs: Basic Metabolic Panel:  Recent Labs Lab 08/23/13 1345 08/24/13 0555 08/25/13 0609 08/26/13 0620 08/27/13 0539  NA 136* 133* 137 138 139  K 3.7 3.3* 3.9 3.1* 3.6*  CL 93* 88* 88* 90* 92*  CO2 30 29 38* 36* 35*  GLUCOSE 220* 247* 233* 110* 155*  BUN 24* 27* 26* 27* 34*  CREATININE 1.68* 1.46* 1.57* 1.50* 1.66*  CALCIUM 9.2 9.0 9.7 9.4 9.3   Liver Function Tests: No results found for this basename: AST, ALT, ALKPHOS, BILITOT, PROT, ALBUMIN,  in the last 168 hours No results found for this basename: LIPASE, AMYLASE,  in the last 168 hours No results found for this basename: AMMONIA,  in the last 168 hours CBC:  Recent Labs Lab 08/23/13 1345 08/25/13 0609  WBC 11.4* 11.4*  NEUTROABS 9.6*  --   HGB 12.6* 14.6  HCT 37.6* 43.8  MCV 89.5 88.0  PLT 126* 174   Cardiac Enzymes: No results found for this basename: CKTOTAL, CKMB, CKMBINDEX, TROPONINI,  in the last 168 hours BNP: BNP (last 3 results)  Recent Labs  08/23/13 1345  PROBNP 2796.0*   CBG:  Recent Labs Lab 08/26/13 1216 08/26/13 1615 08/26/13 2036 08/27/13 0724 08/27/13 1133  GLUCAP 157* 153* 195* 139* 184*       Signed:  Zeynep Fantroy A  [facility] 08/27/2013, 1:59 PM

## 2013-08-27 NOTE — Progress Notes (Signed)
ANTIBIOTIC CONSULT NOTE - follow up  Pharmacy Consult for Vancomycin Indication: cellulitis  Allergies  Allergen Reactions  . Cephalexin   . Promethazine Hcl    Patient Measurements: Height: 6' (182.9 cm) Weight: 218 lb 14.7 oz (99.3 kg) IBW/kg (Calculated) : 77.6  Vital Signs: Temp: 98.3 F (36.8 C) (07/22 0601) Temp src: Oral (07/22 0601) BP: 104/60 mmHg (07/22 0601) Pulse Rate: 65 (07/22 0601) Intake/Output from previous day: 07/21 0701 - 07/22 0700 In: 2250 [P.O.:720; I.V.:30; IV Piggyback:1500] Out: 300 [Urine:300] Intake/Output from this shift:    Labs:  Recent Labs  08/25/13 0609 08/26/13 0620 08/27/13 0539  WBC 11.4*  --   --   HGB 14.6  --   --   PLT 174  --   --   CREATININE 1.57* 1.50* 1.66*   Estimated Creatinine Clearance: 46.2 ml/min (by C-G formula based on Cr of 1.66).  Recent Labs  08/27/13 1141  VANCOTROUGH 18.8     Microbiology: No results found for this or any previous visit (from the past 720 hour(s)).  Medical History: Past Medical History  Diagnosis Date  . CAD (coronary artery disease)     ant MI 50. CABG 2001 [place] D1 SVG LAD SVG om SVG PDA EF 30%  . CHF (congestive heart failure)   . Diabetes mellitus     mellitus? lantus at night  . Hyperlipidemia    Medications:  Scheduled:  . amiodarone  200 mg Oral Daily  . atorvastatin  20 mg Oral q1800  . furosemide  40 mg Oral BID  . heparin  5,000 Units Subcutaneous 3 times per day  . insulin aspart  0-15 Units Subcutaneous TID WC  . insulin aspart  0-5 Units Subcutaneous QHS  . insulin aspart  3 Units Subcutaneous TID WC  . insulin glargine  70 Units Subcutaneous QHS  . potassium chloride  40 mEq Oral BID  . ramipril  20 mg Oral Daily  . sodium chloride  10-40 mL Intracatheter Q12H  . [START ON 08/28/2013] vancomycin  1,000 mg Intravenous Q24H   Assessment: 79 yo diabetic M with R arm cellulitis that has worsened on Augmentin.  Currently on day#5 Vancomycin with plans to  discharge home on IV Vanc to complete 7-14 days of IV antibiotics. Acute on chronic renal failure noted.  Normalized CrCl ~ 35-40 ml/min.   Vancomycin trough elevated today & Scr trending up.   Vancomycin 7/18>> Zosyn 7/18>>7/19  Goal of Therapy:  Vancomycin trough level 10-15 mcg/ml  Plan:  Decrease Vancomycin 1000mg  IV q24h Check weekly Vancomycin trough   Elson Clan 08/27/2013,1:06 PM

## 2013-08-28 NOTE — Progress Notes (Signed)
UR chart review completed.  

## 2013-09-09 ENCOUNTER — Telehealth: Payer: Self-pay | Admitting: Orthopedic Surgery

## 2013-09-09 NOTE — Telephone Encounter (Signed)
When am i seeing him ???

## 2013-09-09 NOTE — Telephone Encounter (Signed)
Phillip Henderson' grandaughter, Phillip Henderson said you ordered a PICC line for antibiotics for Phillip Henderson 08/26/13.  He has been out of the antibiotics for 5 days. Asked if he needs more antibiotics or does the PICC line need to be removed. Said his PCP would not remove the PICC line. Please advise

## 2013-09-09 NOTE — Telephone Encounter (Signed)
He does not have anything scheduled

## 2013-09-10 ENCOUNTER — Telehealth: Payer: Self-pay | Admitting: Family Medicine

## 2013-09-10 NOTE — Telephone Encounter (Signed)
Patient contacted hospitalist office with questions about timing of PICC line removal. My partner and discharging physician Dr. [name] was not available and I was asked to help coordinate care in his absence.  Patient seen by PCP office by [name], PA-C, 7/30 after completion of abx for elbow cellulitis. PICC line was left in place and no further abx were ordered.   Patient contacted orthopedics Dr. [name] and PCP with questions about abx and PICC removal 8/4.  I reviewed chart, discussed with Drs. [name] and Dr. [name] (PCP at [facility]). Patient already has appointment today at 2 pm with Dr. [name]. We discussed case together. Will leave duration of PICC to her discretion but would recommend removal if no further IV abx warranted. Dr. [name] can see patient 8/6 at 10 AM or 2 PM for reassessment if needed.  I updated patient by telephone today.  [name], MD [facility]

## 2013-09-10 NOTE — Telephone Encounter (Signed)
Per [facility], patient will not be following up here for PICC removal, patients PCP Dr [name] will handle

## 2013-09-24 ENCOUNTER — Other Ambulatory Visit: Payer: Self-pay | Admitting: Cardiovascular Disease

## 2013-10-01 ENCOUNTER — Other Ambulatory Visit: Payer: Self-pay | Admitting: Cardiovascular Disease

## 2013-10-01 ENCOUNTER — Other Ambulatory Visit (HOSPITAL_COMMUNITY): Payer: Self-pay | Admitting: Nephrology

## 2013-10-01 DIAGNOSIS — N289 Disorder of kidney and ureter, unspecified: Secondary | ICD-10-CM

## 2013-10-15 ENCOUNTER — Other Ambulatory Visit: Payer: Self-pay

## 2013-10-15 MED ORDER — METOLAZONE 5 MG PO TABS: 2.5000 mg | ORAL_TABLET | Freq: Two times a day (BID) | ORAL | Status: AC | PRN

## 2013-10-17 ENCOUNTER — Ambulatory Visit (HOSPITAL_COMMUNITY)
Admission: RE | Admit: 2013-10-17 | Discharge: 2013-10-17 | Disposition: A | Discharge status: Home / Self Care | Payer: Medicare Other | Source: Ambulatory Visit | Attending: Nephrology | Admitting: Nephrology

## 2013-10-17 DIAGNOSIS — E119 Type 2 diabetes mellitus without complications: Secondary | ICD-10-CM | POA: Insufficient documentation

## 2013-10-17 DIAGNOSIS — N289 Disorder of kidney and ureter, unspecified: Secondary | ICD-10-CM | POA: Diagnosis present

## 2013-10-27 ENCOUNTER — Other Ambulatory Visit: Payer: Self-pay | Admitting: Cardiovascular Disease

## 2013-12-18 ENCOUNTER — Other Ambulatory Visit: Payer: Self-pay | Admitting: Cardiovascular Disease

## 2014-03-25 NOTE — Progress Notes (Signed)
Patient ID: Phillip Henderson, male   DOB: [birth year], 80 y.o.   MRN: [id number] Phillip Henderson is a  80 y.o. patient we are following for ongoing assessment and treatment of ischemic CM with EF of 25%, hypercholesterolemia, and systolic CHF  He refuses further cardiac testing, or ICD pacemaker. On last visit we made medication changes to include stopping cardizem, and metoprolol, started coreg 12.5 mg BID, and had lasix increased to 40 mg BID. He has 3 days of metolazone. He has seen his PCP Dr, Regino Schultze and metolazone was stopped after two days due to nausea. He has lost 13 lbs since last visit, but now has complaints of constipation and fullness in his abdomen with burping and gas. Breathing is much better.  Previous anterior MI in late 90's and CABG inj 2001  Coronary artery bypass grafting x 5  internal mammary artery to the first diagonal coronary artery, sequential  saphenous vein graft to the distal left anterior descending, reversed  saphenous vein graft to the obtuse marginal, sequential saphenous vein graft  to the proximal posterior descending and distal posterior descending.   Hosptalized in May 2012 at [facility] for CHF Found to be in afib. Subsequently anticoagulated and had TEE/DCC on July 3rd. Has been on amiodarone 400 bid to help maintain NSR   No issues Chronic LE venous disease and will not wear support hose    ROS: Denies fever, malais, weight loss, blurry vision, decreased visual acuity, cough, sputum, SOB, hemoptysis, pleuritic pain, palpitaitons, heartburn, abdominal pain, melena, lower extremity edema, claudication, or rash.  All other systems reviewed and negative  General: Affect appropriate Healthy:  appears stated age HEENT: normal Neck supple with no adenopathy JVP normal no bruits no thyromegaly Lungs clear with no wheezing and good diaphragmatic motion Heart:  S1/S2 SEM  murmur, no rub, gallop or click PMI normal Abdomen: benighn, BS positve, no tenderness, no AAA no  bruit.  No HSM or HJR Distal pulses intact with no bruits Plus 1-2 bilateral  Edema with marked varicosities Neuro non-focal Skin warm and dry No muscular weakness   Current Outpatient Prescriptions  Medication Sig Dispense Refill  . insulin glargine (LANTUS) 100 UNIT/ML injection Inject 65-70 Units into the skin at bedtime. Sliding scale based on blood sugar readings    . potassium chloride (KLOR-CON) 10 MEQ CR tablet Take 10 mEq by mouth daily.     . ramipril (ALTACE) 10 MG capsule Take 20 mg by mouth daily.     . carvedilol (COREG) 12.5 MG tablet TAKE 1 TABLET BY MOUTH TWICE DAILY 60 tablet 6  . clopidogrel (PLAVIX) 75 MG tablet TAKE 1 TABLET BY MOUTH DAILY 90 tablet 3  . furosemide (LASIX) 40 MG tablet Take 40 mg by mouth as directed. Takes twice daily and alternates three times daily every other day.    . metolazone (ZAROXOLYN) 5 MG tablet Take 0.5 tablets (2.5 mg total) by mouth 2 (two) times daily as needed (for fluid retention). 30 tablet 3  . PACERONE 200 MG tablet TAKE 1 TABLET BY MOUTH EVERY DAY 30 tablet 6  . potassium chloride (K-DUR,KLOR-CON) 10 MEQ tablet TAKE ONE AND ONE-HALF TABLETS BY MOUTH EVERY NIGHT AT BEDTIME 45 tablet 3  . pravastatin (PRAVACHOL) 40 MG tablet Take 40 mg by mouth at bedtime.    . vancomycin (VANCOCIN) 1 GM/200ML SOLN Inject 200 mLs (1,000 mg total) into the vein daily. 200 mL 7   No current facility-administered medications for this visit.  Allergies  Cephalexin and Promethazine hcl  Electrocardiogram:  SR rate 59 P wave seen V4  IVCD limb lead reversal   2/15    Assessment and Plan

## 2014-03-26 ENCOUNTER — Ambulatory Visit (INDEPENDENT_AMBULATORY_CARE_PROVIDER_SITE_OTHER): Payer: Medicare Other | Admitting: Cardiovascular Disease

## 2014-03-26 ENCOUNTER — Encounter: Payer: Self-pay | Admitting: Cardiovascular Disease

## 2014-03-26 VITALS — BP 126/80 | HR 66 | Ht 72.0 in | Wt 215.1 lb

## 2014-03-26 DIAGNOSIS — Z79899 Other long term (current) drug therapy: Secondary | ICD-10-CM

## 2014-03-26 DIAGNOSIS — I480 Paroxysmal atrial fibrillation: Secondary | ICD-10-CM

## 2014-03-26 DIAGNOSIS — I10 Essential (primary) hypertension: Secondary | ICD-10-CM

## 2014-03-26 DIAGNOSIS — R0600 Dyspnea, unspecified: Secondary | ICD-10-CM | POA: Diagnosis not present

## 2014-03-26 DIAGNOSIS — I2510 Atherosclerotic heart disease of native coronary artery without angina pectoris: Secondary | ICD-10-CM

## 2014-03-26 DIAGNOSIS — I255 Ischemic cardiomyopathy: Secondary | ICD-10-CM

## 2014-03-26 LAB — HEPATIC FUNCTION PANEL
ALK PHOS: 89 U/L (ref 39–117)
ALT: 16 U/L (ref 0–53)
AST: 22 U/L (ref 0–37)
Albumin: 4.1 g/dL (ref 3.5–5.2)
BILIRUBIN DIRECT: 0.2 mg/dL (ref 0.0–0.3)
TOTAL PROTEIN: 7.5 g/dL (ref 6.0–8.3)
Total Bilirubin: 0.8 mg/dL (ref 0.2–1.2)

## 2014-03-26 LAB — BASIC METABOLIC PANEL
BUN: 63 mg/dL — AB (ref 6–23)
CALCIUM: 9.4 mg/dL (ref 8.4–10.5)
CO2: 30 mEq/L (ref 19–32)
Chloride: 92 mEq/L — ABNORMAL LOW (ref 96–112)
Creatinine, Ser: 2.95 mg/dL — ABNORMAL HIGH (ref 0.40–1.50)
GFR: 22.09 mL/min — ABNORMAL LOW (ref >60.00)
GLUCOSE: 232 mg/dL — AB (ref 70–99)
Potassium: 3.2 mEq/L — ABNORMAL LOW (ref 3.5–5.1)
SODIUM: 132 meq/L — AB (ref 135–145)

## 2014-03-26 LAB — TSH: TSH: 0.14 u[IU]/mL — ABNORMAL LOW (ref 0.35–4.50)

## 2014-03-26 LAB — BRAIN NATRIURETIC PEPTIDE: Pro B Natriuretic peptide (BNP): 139 pg/mL — ABNORMAL HIGH (ref 0.0–100.0)

## 2014-03-26 NOTE — Assessment & Plan Note (Signed)
Main NSR  Needs PFTls DLCO Thyroid and liver tests on amiodarone

## 2014-03-26 NOTE — Assessment & Plan Note (Signed)
Euvolemic has some LE edema from varicosities  Taking occasional zaroxyln  Check BMET/BNP  Refuses AICD/BiV pacer

## 2014-03-26 NOTE — Assessment & Plan Note (Signed)
Well controlled.  Continue current medications and low sodium Dash type diet.    

## 2014-03-26 NOTE — Addendum Note (Signed)
Addended by: Scherrie Bateman E on: 03/26/2014 11:51 AM   Modules accepted: Orders

## 2014-03-26 NOTE — Assessment & Plan Note (Signed)
Stable with no angina and good activity level.  Continue medical Rx  

## 2014-03-26 NOTE — Patient Instructions (Addendum)
Your physician wants you to follow-up in:   6 MONTHS  WITH DR Eden Emms IN [place] OFFICE  You will receive a reminder letter in the mail two months in advance. If you don't receive a letter, please call our office to schedule the follow-up appointment. Your physician recommends that you continue on your current medications as directed. Please refer to the Current Medication list given to you today. Your physician has recommended that you have a pulmonary function test. Pulmonary Function Tests are a group of tests that measure how well air moves in and out of your lungs.  AT  [facility]  Your physician recommends that you return for lab work in: TODAY   BMET BNP LIVER  AND   TSH

## 2014-03-27 ENCOUNTER — Telehealth: Payer: Self-pay | Admitting: Nurse Practitioner

## 2014-03-27 ENCOUNTER — Ambulatory Visit (HOSPITAL_COMMUNITY)
Admission: RE | Admit: 2014-03-27 | Discharge: 2014-03-27 | Disposition: A | Discharge status: Home / Self Care | Payer: Medicare Other | Source: Ambulatory Visit | Attending: Cardiovascular Disease | Admitting: Cardiovascular Disease

## 2014-03-27 ENCOUNTER — Other Ambulatory Visit: Payer: Self-pay

## 2014-03-27 DIAGNOSIS — Z79899 Other long term (current) drug therapy: Secondary | ICD-10-CM | POA: Insufficient documentation

## 2014-03-27 DIAGNOSIS — F17210 Nicotine dependence, cigarettes, uncomplicated: Secondary | ICD-10-CM | POA: Diagnosis not present

## 2014-03-27 DIAGNOSIS — R002 Palpitations: Secondary | ICD-10-CM

## 2014-03-27 DIAGNOSIS — R0609 Other forms of dyspnea: Secondary | ICD-10-CM | POA: Insufficient documentation

## 2014-03-27 DIAGNOSIS — I10 Essential (primary) hypertension: Secondary | ICD-10-CM

## 2014-03-27 LAB — PULMONARY FUNCTION TEST
DL/VA % pred: 69 %
DL/VA: 3.25 ml/min/mmHg/L
DLCO UNC % PRED: 37 %
DLCO cor % pred: 37 %
DLCO cor: 13.3 ml/min/mmHg
DLCO unc: 13.3 ml/min/mmHg
FEF 25-75 Post: 2.17 L/sec
FEF 25-75 Pre: 1.7 L/sec
FEF2575-%CHANGE-POST: 27 %
FEF2575-%PRED-POST: 93 %
FEF2575-%Pred-Pre: 73 %
FEV1-%Change-Post: 6 %
FEV1-%PRED-PRE: 60 %
FEV1-%Pred-Post: 64 %
FEV1-PRE: 1.96 L
FEV1-Post: 2.08 L
FEV1FVC-%CHANGE-POST: 1 %
FEV1FVC-%PRED-PRE: 106 %
FEV6-%Change-Post: 5 %
FEV6-%Pred-Post: 62 %
FEV6-%Pred-Pre: 59 %
FEV6-POST: 2.65 L
FEV6-Pre: 2.51 L
FEV6FVC-%Change-Post: 0 %
FEV6FVC-%PRED-PRE: 106 %
FEV6FVC-%Pred-Post: 105 %
FVC-%CHANGE-POST: 4 %
FVC-%PRED-PRE: 56 %
FVC-%Pred-Post: 59 %
FVC-POST: 2.66 L
FVC-Pre: 2.55 L
POST FEV1/FVC RATIO: 78 %
PRE FEV1/FVC RATIO: 77 %
PRE FEV6/FVC RATIO: 100 %
Post FEV6/FVC ratio: 99 %
RV % PRED: 116 %
RV: 3.16 L
TLC % PRED: 74 %
TLC: 5.55 L

## 2014-03-27 MED ORDER — POTASSIUM CHLORIDE CRYS ER 10 MEQ PO TBCR: 20.0000 meq | EXTENDED_RELEASE_TABLET | Freq: Every evening | ORAL | Status: AC

## 2014-03-27 MED ORDER — ALBUTEROL SULFATE (2.5 MG/3ML) 0.083% IN NEBU
2.5000 mg | INHALATION_SOLUTION | Freq: Once | RESPIRATORY_TRACT | Status: AC
  Administered 2014-03-27: 2.5 mg via RESPIRATORY_TRACT

## 2014-03-27 NOTE — Telephone Encounter (Signed)
   Received call from pts wife through answering service.  She was calling back related to message left from office.  No phone notes available for review.  I spoke to the patient's niece when I called back.  I reviewed pts recent labs (below) and recommended the following:  -take lasix 40 mg x 1 tomorrow instead of tid as he would usually do. -reduce lasix to 40 mg bid (instead of alternating with tid) beginning on Sunday. -take an additional 40 meq of kcl tonight and then resume usual 15 meq daily tomorrow. -do not take metolazone (hasn't used in 3 wks). -continue ramipril. -TSH suppressed and he will need f/u labs. -Office to call pt on Monday to arrange for f/u bmet, TSH, FT4, FT3 @ [facility] in [place]. -further instructions for diuretic mgmt following repeat labs.  Pts niece verbalized understanding.  Pt will continue to weigh himself daily.  Lab Results  Component Value Date   CREATININE 2.95* 03/26/2014   BUN 63* 03/26/2014   NA 132* 03/26/2014   K 3.2* 03/26/2014   CL 92* 03/26/2014   CO2 30 03/26/2014   Lab Results  Component Value Date   TSH 0.14* 03/26/2014    Nicolasa Ducking, NP 03/27/2014, 5:50 PM

## 2014-03-30 ENCOUNTER — Other Ambulatory Visit: Payer: Self-pay | Admitting: *Deleted

## 2014-03-30 DIAGNOSIS — R0600 Dyspnea, unspecified: Secondary | ICD-10-CM

## 2014-03-30 NOTE — Addendum Note (Signed)
Addended by: Kerney Elbe on: 03/30/2014 04:40 PM   Modules accepted: Orders

## 2014-03-30 NOTE — Telephone Encounter (Signed)
Will forward to [employer] for follow-up

## 2014-03-30 NOTE — Telephone Encounter (Signed)
Patient states he would like me to speak with his granddaughter. Spoke with granddaughter. All questions answered  She voiced understanding.

## 2014-04-03 ENCOUNTER — Other Ambulatory Visit: Payer: Self-pay | Admitting: Nurse Practitioner

## 2014-04-03 LAB — BASIC METABOLIC PANEL
BUN: 29 mg/dL — ABNORMAL HIGH (ref 6–23)
CALCIUM: 9 mg/dL (ref 8.4–10.5)
CO2: 26 mEq/L (ref 19–32)
CREATININE: 2.06 mg/dL — AB (ref 0.50–1.35)
Chloride: 104 mEq/L (ref 96–112)
Glucose, Bld: 97 mg/dL (ref 70–99)
Potassium: 4.5 mEq/L (ref 3.5–5.3)
SODIUM: 142 meq/L (ref 135–145)

## 2014-04-03 LAB — TSH: TSH: 0.036 u[IU]/mL — ABNORMAL LOW (ref 0.350–4.500)

## 2014-04-03 LAB — T3, FREE: T3, Free: 3.1 pg/mL (ref 2.3–4.2)

## 2014-04-03 LAB — T4, FREE: Free T4: 2.06 ng/dL — ABNORMAL HIGH (ref 0.80–1.80)

## 2014-04-06 ENCOUNTER — Telehealth: Payer: Self-pay | Admitting: Cardiovascular Disease

## 2014-04-06 NOTE — Telephone Encounter (Signed)
LM TO CALL BACK ./CY 

## 2014-04-06 NOTE — Telephone Encounter (Signed)
New message      Dr Eden Emms want pt to see a lung doctor in [place].  Patient needs a referral to see Dr Juanetta Gosling in [place].  Also, question about fluid build up.

## 2014-04-06 NOTE — Telephone Encounter (Signed)
-----   Message from Ok Anis, NP sent at 04/06/2014  4:23 PM EST ----- This is a pt of PN that he sees in [place].  I became involved in care b/c he had abnl labs after hours.  His creat has improved and he should continue on lasix 40 bid (instead of alternating dosing/days the way he used to). Cont to avoid metolazone.  He will need to continue to weigh himself daily and report gains or symptoms.  His TFT's are abnl and suggestive of hyperthyroidism.  His PCP is not listed in the system.  Could you please contact pt to make him aware of results and find out who his PCP is and then forward TFT results the him/her.  Pt should f/u with PCP w/in next week re: abnl thyroid testing.  I will also copy Dr. Eden Emms on this note as the patient is on amiodarone and this may need to be adjusted.

## 2014-04-06 NOTE — Telephone Encounter (Signed)
I spoke with pt and daughter.They understand to stop amiodarone,they will see Dr [name] at [facility] ASAP,daughter will call them tomorrow.pt will resume lasix 40 mg bid

## 2014-04-16 ENCOUNTER — Other Ambulatory Visit (HOSPITAL_COMMUNITY): Payer: Self-pay | Admitting: Family Medicine

## 2014-04-16 DIAGNOSIS — E0590 Thyrotoxicosis, unspecified without thyrotoxic crisis or storm: Secondary | ICD-10-CM

## 2014-04-20 ENCOUNTER — Ambulatory Visit (HOSPITAL_COMMUNITY)
Admission: RE | Admit: 2014-04-20 | Discharge: 2014-04-20 | Disposition: A | Discharge status: Home / Self Care | Payer: Medicare Other | Source: Ambulatory Visit | Attending: Family Medicine | Admitting: Family Medicine

## 2014-04-20 DIAGNOSIS — E0590 Thyrotoxicosis, unspecified without thyrotoxic crisis or storm: Secondary | ICD-10-CM | POA: Diagnosis present

## 2014-04-20 DIAGNOSIS — E0500 Thyrotoxicosis with diffuse goiter without thyrotoxic crisis or storm: Secondary | ICD-10-CM | POA: Insufficient documentation

## 2014-06-07 DEATH — deceased

## 2014-11-12 ENCOUNTER — Telehealth: Payer: Self-pay | Admitting: Cardiovascular Disease

## 2014-11-12 NOTE — Telephone Encounter (Signed)
I called to follow up on a recall in the system and the patients grand daugther told me that the patient passed away in June 05, 2022 of this year.  I sent a staff message to Bennie Dallas to notify HIM and request they update his chart.

## 2016-02-28 IMAGING — CR DG ELBOW COMPLETE 3+V*R*
4 series · 4 of 4 positions shown · non-contrast
Comparison: None.

CLINICAL DATA: Right arm pain, redness and swelling.

EXAM:
RIGHT ELBOW - COMPLETE 3+ VIEW

[view not recorded (1 of 4)]
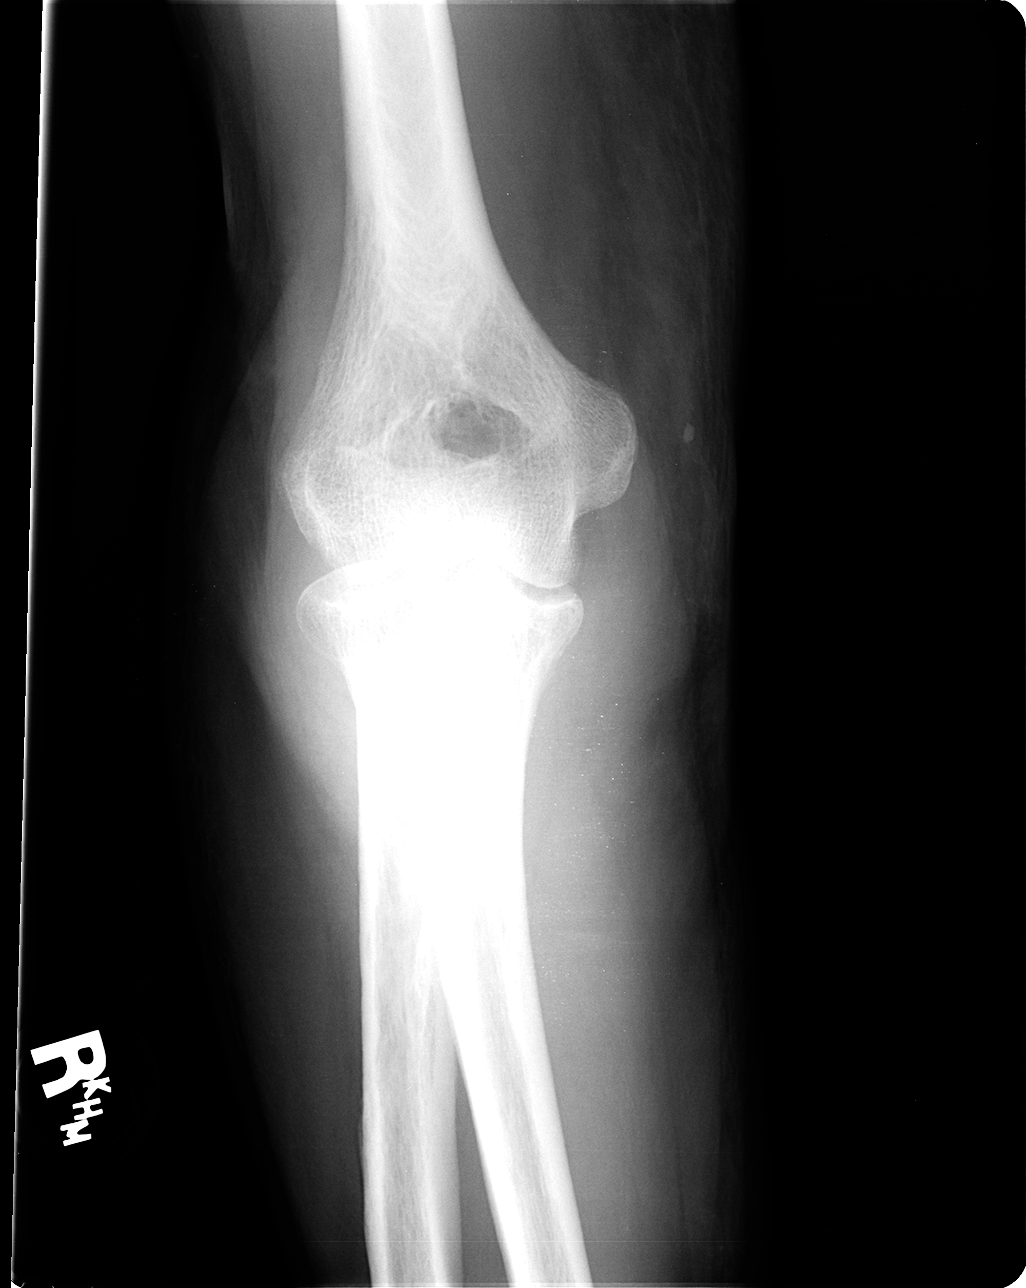

[view not recorded (2 of 4)]
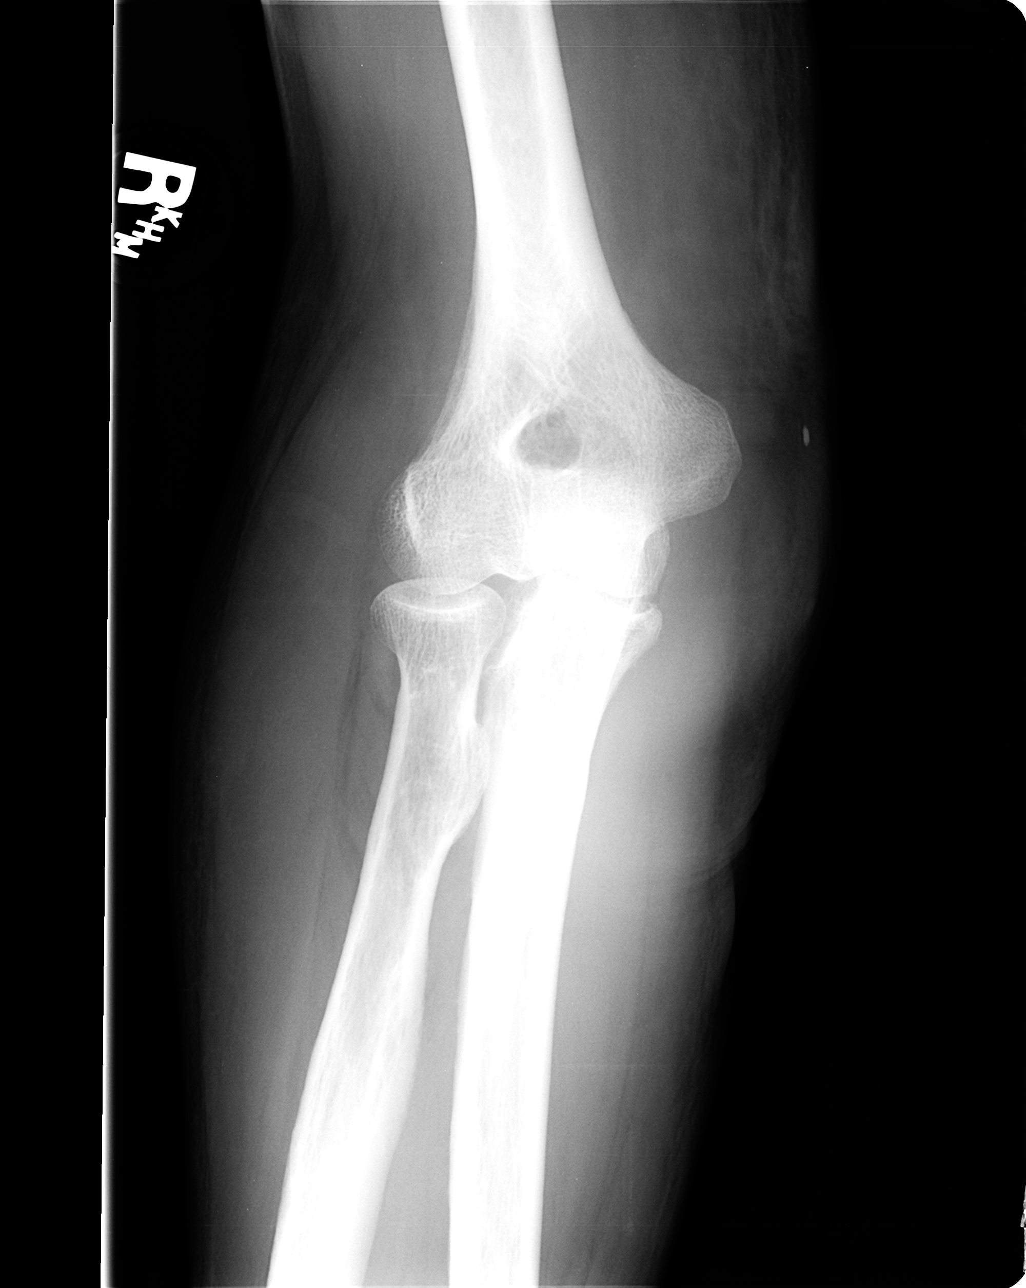

[view not recorded (3 of 4)]
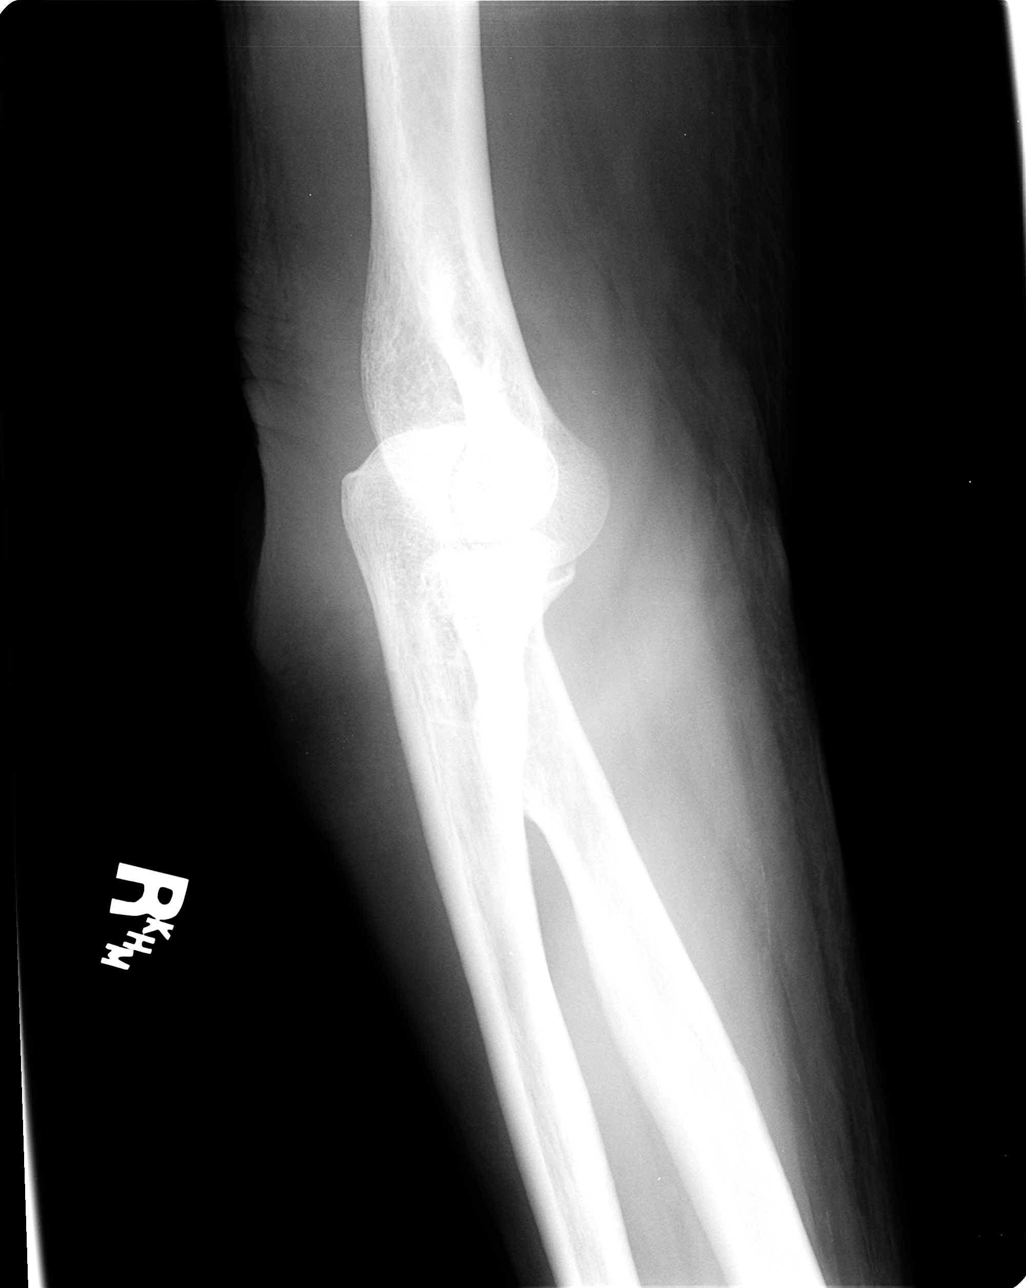

[view not recorded (4 of 4)]
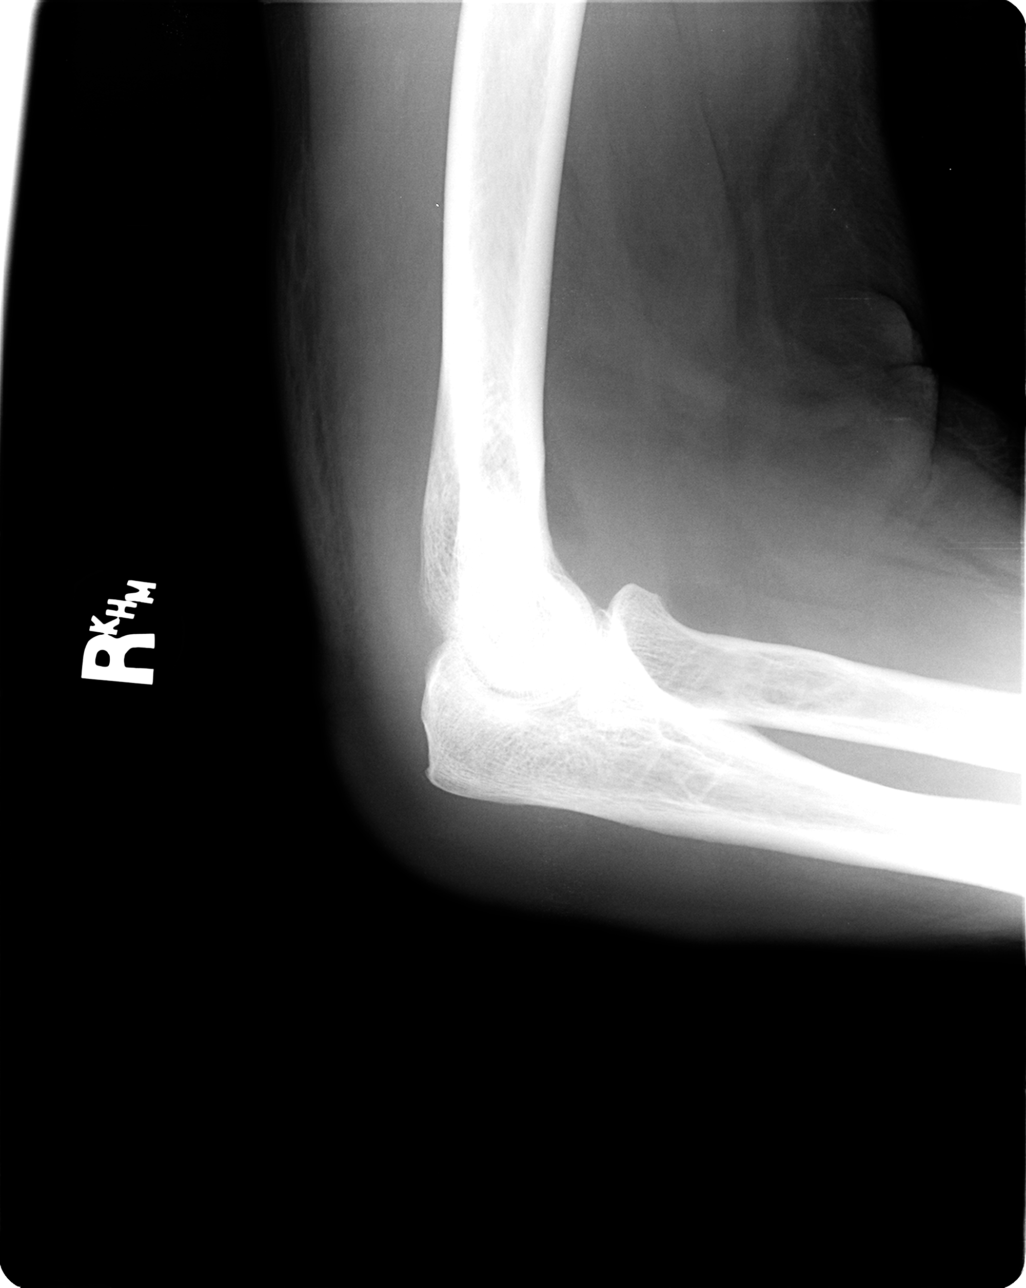

[4 of 4 positions shown; findings below may reference images not displayed]

FINDINGS: No fracture. The elbow joint is normally spaced and aligned. No
joint effusion. Subcutaneous soft tissue edema is noted medially and
posteriorly. No radiopaque foreign body.
IMPRESSION: No fracture or bone lesion.  No elbow joint abnormality.
